# Patient Record
Sex: Male | Born: 1980 | Race: Black or African American | Hispanic: No | Marital: Single | Smoking: Current every day smoker
Health system: Southern US, Community
[De-identification: ages and names within clinical notes are randomized; demographics above are authoritative.]

## PROBLEM LIST (undated history)

## (undated) DIAGNOSIS — Z720 Tobacco use: Secondary | ICD-10-CM

## (undated) DIAGNOSIS — J42 Unspecified chronic bronchitis: Secondary | ICD-10-CM

## (undated) DIAGNOSIS — J9383 Other pneumothorax: Secondary | ICD-10-CM

## (undated) DIAGNOSIS — F1210 Cannabis abuse, uncomplicated: Secondary | ICD-10-CM

## (undated) HISTORY — PX: EYE SURGERY: SHX253

## (undated) HISTORY — DX: Tobacco use: Z72.0

## (undated) HISTORY — PX: DENTAL SURGERY: SHX609

## (undated) HISTORY — DX: Cannabis abuse, uncomplicated: F12.10

---
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
## 2002-02-03 ENCOUNTER — Emergency Department (HOSPITAL_COMMUNITY): Admission: EM | Admit: 2002-02-03 | Discharge: 2002-02-04 | Payer: Self-pay | Admitting: *Deleted

## 2002-03-24 ENCOUNTER — Emergency Department (HOSPITAL_COMMUNITY): Admission: EM | Admit: 2002-03-24 | Discharge: 2002-03-24 | Payer: Self-pay | Admitting: Emergency Medicine

## 2002-07-01 ENCOUNTER — Emergency Department (HOSPITAL_COMMUNITY): Admission: EM | Admit: 2002-07-01 | Discharge: 2002-07-01 | Payer: Self-pay | Admitting: Emergency Medicine

## 2002-09-20 ENCOUNTER — Emergency Department (HOSPITAL_COMMUNITY): Admission: EM | Admit: 2002-09-20 | Discharge: 2002-09-20 | Payer: Self-pay

## 2003-01-21 ENCOUNTER — Emergency Department (HOSPITAL_COMMUNITY): Admission: EM | Admit: 2003-01-21 | Discharge: 2003-01-21 | Payer: Self-pay | Admitting: Emergency Medicine

## 2003-06-30 ENCOUNTER — Emergency Department (HOSPITAL_COMMUNITY): Admission: EM | Admit: 2003-06-30 | Discharge: 2003-06-30 | Payer: Self-pay | Admitting: Emergency Medicine

## 2003-11-25 ENCOUNTER — Emergency Department (HOSPITAL_COMMUNITY): Admission: EM | Admit: 2003-11-25 | Discharge: 2003-11-25 | Payer: Self-pay | Admitting: Family Medicine

## 2003-12-01 ENCOUNTER — Emergency Department (HOSPITAL_COMMUNITY): Admission: EM | Admit: 2003-12-01 | Discharge: 2003-12-01 | Payer: Self-pay | Admitting: Family Medicine

## 2003-12-12 ENCOUNTER — Encounter: Admission: RE | Admit: 2003-12-12 | Discharge: 2003-12-12 | Payer: Self-pay | Admitting: Family Medicine

## 2003-12-30 ENCOUNTER — Encounter: Admission: RE | Admit: 2003-12-30 | Discharge: 2003-12-30 | Payer: Self-pay | Admitting: Sports Medicine

## 2004-02-25 ENCOUNTER — Emergency Department (HOSPITAL_COMMUNITY): Admission: EM | Admit: 2004-02-25 | Discharge: 2004-02-25 | Payer: Self-pay | Admitting: Emergency Medicine

## 2004-04-13 ENCOUNTER — Emergency Department (HOSPITAL_COMMUNITY): Admission: EM | Admit: 2004-04-13 | Discharge: 2004-04-13 | Payer: Self-pay | Admitting: Family Medicine

## 2004-04-19 ENCOUNTER — Encounter: Admission: RE | Admit: 2004-04-19 | Discharge: 2004-04-19 | Payer: Self-pay | Admitting: Sports Medicine

## 2004-04-23 ENCOUNTER — Encounter: Admission: RE | Admit: 2004-04-23 | Discharge: 2004-04-23 | Payer: Self-pay | Admitting: Family Medicine

## 2004-05-06 ENCOUNTER — Emergency Department (HOSPITAL_COMMUNITY): Admission: EM | Admit: 2004-05-06 | Discharge: 2004-05-06 | Payer: Self-pay | Admitting: Emergency Medicine

## 2005-04-09 ENCOUNTER — Emergency Department (HOSPITAL_COMMUNITY): Admission: EM | Admit: 2005-04-09 | Discharge: 2005-04-09 | Payer: Self-pay | Admitting: Emergency Medicine

## 2005-06-02 ENCOUNTER — Ambulatory Visit: Payer: Self-pay | Admitting: Family Medicine

## 2006-01-06 ENCOUNTER — Ambulatory Visit (HOSPITAL_COMMUNITY): Admission: RE | Admit: 2006-01-06 | Discharge: 2006-01-06 | Payer: Self-pay | Admitting: Chiropractic Medicine

## 2006-12-07 DIAGNOSIS — F17200 Nicotine dependence, unspecified, uncomplicated: Secondary | ICD-10-CM

## 2008-08-09 ENCOUNTER — Emergency Department (HOSPITAL_COMMUNITY): Admission: EM | Admit: 2008-08-09 | Discharge: 2008-08-09 | Payer: Self-pay | Admitting: Emergency Medicine

## 2011-03-02 ENCOUNTER — Encounter: Payer: Self-pay | Admitting: Family Medicine

## 2011-03-02 ENCOUNTER — Ambulatory Visit (INDEPENDENT_AMBULATORY_CARE_PROVIDER_SITE_OTHER): Payer: BC Managed Care – PPO | Admitting: Family Medicine

## 2011-03-02 VITALS — BP 153/92 | HR 102 | Temp 99.0°F | Ht 67.0 in | Wt 128.0 lb

## 2011-03-02 DIAGNOSIS — Z20828 Contact with and (suspected) exposure to other viral communicable diseases: Secondary | ICD-10-CM

## 2011-03-02 DIAGNOSIS — F17200 Nicotine dependence, unspecified, uncomplicated: Secondary | ICD-10-CM

## 2011-03-02 LAB — HIV ANTIBODY (ROUTINE TESTING W REFLEX): HIV: NONREACTIVE

## 2011-03-02 NOTE — Patient Instructions (Signed)
It was nice to see you today.  Please come back for labs.

## 2011-03-03 ENCOUNTER — Encounter: Payer: Self-pay | Admitting: Family Medicine

## 2011-03-03 NOTE — Assessment & Plan Note (Signed)
Explained to patient that BV is NOT and STD. Will go ahead and screen. Patient would like to get HIV, RPR, GC/CH - urine.

## 2011-03-03 NOTE — Progress Notes (Signed)
  Subjective:    Patient ID: Hunter Newman, male    DOB: [birth year], 30 y.o.   MRN: [id number]  HPI  1. New Patient: Reviewed Hx and updated chart.  2. STD Testing: Girlfriend recently Dx with BV. Patient wants to know more about this and be tested for STDs.  3. Tobacco Abuse: Daily.  Review of Systems No fever/chills, N/V/D/C, abdominal pain, penile DC, rash.    Objective:   Physical Exam  Vitals reviewed. Constitutional: He appears well-developed and well-nourished. No distress.  Cardiovascular: Normal rate, regular rhythm, normal heart sounds and intact distal pulses.   Pulmonary/Chest: Effort normal and breath sounds normal.  Abdominal: Soft. Bowel sounds are normal.      Assessment & Plan:

## 2011-03-03 NOTE — Assessment & Plan Note (Signed)
Advised to quit smoking °

## 2011-03-08 ENCOUNTER — Other Ambulatory Visit: Payer: BC Managed Care – PPO

## 2011-03-08 DIAGNOSIS — Z20828 Contact with and (suspected) exposure to other viral communicable diseases: Secondary | ICD-10-CM

## 2011-03-08 NOTE — Progress Notes (Signed)
gc urine done today Hunter Newman

## 2011-03-09 LAB — GC/CHLAMYDIA PROBE AMP, URINE
Chlamydia, Swab/Urine, PCR: POSITIVE — AB
GC Probe Amp, Urine: NEGATIVE

## 2011-03-10 ENCOUNTER — Telehealth: Payer: Self-pay | Admitting: *Deleted

## 2011-03-10 ENCOUNTER — Ambulatory Visit (INDEPENDENT_AMBULATORY_CARE_PROVIDER_SITE_OTHER): Payer: BC Managed Care – PPO | Admitting: *Deleted

## 2011-03-10 DIAGNOSIS — A749 Chlamydial infection, unspecified: Secondary | ICD-10-CM

## 2011-03-10 DIAGNOSIS — A7489 Other chlamydial diseases: Secondary | ICD-10-CM

## 2011-03-10 MED ORDER — AZITHROMYCIN 1 G PO PACK
1.0000 g | PACK | Freq: Once | ORAL | Status: AC
  Administered 2011-03-10: 1 g via ORAL

## 2011-03-10 NOTE — Telephone Encounter (Signed)
Message copied by Arlyss Repress on Thu Mar 10, 2011  2:13 PM ------      Message from: Helane Rima R      Created: Thu Mar 10, 2011  1:16 PM       Please call patient and inform him that he is positive for chlamydia. He needs to come in for Azithromycin 1 g slurry. Please inform him that his partner needs to be treated as well. I think that she is a patient here - if you give me her name, I will order treatment if she hasn't already gotten it.

## 2011-03-10 NOTE — Telephone Encounter (Signed)
Called pt and informed of results. He will schedule nurse visit for treatment. Lorenda Hatchet, Renato Battles

## 2011-03-10 NOTE — Progress Notes (Signed)
Patient requesting results of HIV . Advised him that test was negative.

## 2011-03-11 NOTE — Progress Notes (Signed)
Communicable Disease report faxed to [facility].

## 2011-06-13 ENCOUNTER — Emergency Department (HOSPITAL_COMMUNITY)
Admission: EM | Admit: 2011-06-13 | Discharge: 2011-06-13 | Disposition: A | Discharge status: Home / Self Care | Payer: BC Managed Care – PPO | Attending: Emergency Medicine | Admitting: Emergency Medicine

## 2011-06-13 DIAGNOSIS — S058X9A Other injuries of unspecified eye and orbit, initial encounter: Secondary | ICD-10-CM | POA: Insufficient documentation

## 2011-06-13 DIAGNOSIS — IMO0002 Reserved for concepts with insufficient information to code with codable children: Secondary | ICD-10-CM | POA: Insufficient documentation

## 2012-05-14 ENCOUNTER — Emergency Department (HOSPITAL_COMMUNITY): Payer: BC Managed Care – PPO

## 2012-05-14 ENCOUNTER — Encounter (HOSPITAL_COMMUNITY): Payer: Self-pay | Admitting: *Deleted

## 2012-05-14 ENCOUNTER — Emergency Department (HOSPITAL_COMMUNITY)
Admission: EM | Admit: 2012-05-14 | Discharge: 2012-05-14 | Disposition: A | Discharge status: Home / Self Care | Payer: BC Managed Care – PPO | Attending: Emergency Medicine | Admitting: Emergency Medicine

## 2012-05-14 DIAGNOSIS — R05 Cough: Secondary | ICD-10-CM

## 2012-05-14 DIAGNOSIS — J069 Acute upper respiratory infection, unspecified: Secondary | ICD-10-CM

## 2012-05-14 DIAGNOSIS — F17200 Nicotine dependence, unspecified, uncomplicated: Secondary | ICD-10-CM | POA: Insufficient documentation

## 2012-05-14 NOTE — ED Notes (Signed)
Pt c/o HA, cough, sneezing over weekend.

## 2012-05-14 NOTE — ED Notes (Signed)
Pt reports having productive cough blood tinged sputum since Friday,along nasal congestion. Right sided headache, and pain/ numbness on right side of mouth. Pain 6/10.

## 2012-05-14 NOTE — ED Provider Notes (Signed)
History     CSN: [id number]  Arrival date & time 05/14/12  1443   First MD Initiated Contact with Patient 05/14/12 1656      Chief Complaint  Patient presents with  . Headache  . URI    (Consider location/radiation/quality/duration/timing/severity/associated sxs/prior treatment) HPI Comments: Patient presents with complaint of upper respiratory tract infection symptoms as well as cough for the past 3 days. Patient states he has had nasal congestion and a runny nose. He has had a sore throat. He denies nausea or vomiting. He states that he has been coughing up blood. He states that he has noted approximately a teaspoon of blood when he coughed however this is improved today. Patient does not have a history of bleeding problems. Patient has been using over-the-counter cold medicine with mild relief. Otherwise nothing makes symptoms better or worse. Onset was gradual. Course is constant.  Patient is a 31 y.o. male presenting with URI. The history is provided by the patient.  URI The primary symptoms include fatigue, headaches, sore throat and cough. Primary symptoms do not include fever, ear pain, wheezing, abdominal pain, nausea, vomiting, myalgias or rash. The current episode started 3 to 5 days ago. This is a new problem. The problem has not changed since onset. Symptoms associated with the illness include sinus pressure, congestion and rhinorrhea.    Past Medical History  Diagnosis Date  . Tobacco abuse   . Marijuana abuse     History reviewed. No pertinent past surgical history.  History reviewed. No pertinent family history.  History  Substance Use Topics  . Smoking status: Current Everyday Smoker  . Smokeless tobacco: Not on file  . Alcohol Use: Yes     occasionally      Review of Systems  Constitutional: Positive for fatigue. Negative for fever.  HENT: Positive for congestion, sore throat, rhinorrhea and sinus pressure. Negative for ear pain.   Eyes: Negative for  redness.  Respiratory: Positive for cough. Negative for wheezing.   Cardiovascular: Negative for chest pain.  Gastrointestinal: Negative for nausea, vomiting, abdominal pain and diarrhea.  Genitourinary: Negative for dysuria.  Musculoskeletal: Negative for myalgias.  Skin: Negative for rash.  Neurological: Positive for headaches.    Allergies  Review of patient's allergies indicates no known allergies.  Home Medications   Current Outpatient Rx  Name Route Sig Dispense Refill  . CHILDRENS NYQUIL COLD/COUGH PO Oral Take 2 tablets by mouth 2 (two) times daily as needed. Cold symptoms      BP 125/76  Pulse 81  Temp 98.7 F (37.1 C) (Oral)  Resp 16  SpO2 99%  Physical Exam  Nursing note and vitals reviewed. Constitutional: He appears well-developed and well-nourished.  HENT:  Head: Normocephalic and atraumatic.  Right Ear: Hearing, tympanic membrane and ear canal normal.  Left Ear: Hearing, tympanic membrane and ear canal normal.  Nose: Mucosal edema and rhinorrhea present. Right sinus exhibits no maxillary sinus tenderness and no frontal sinus tenderness. Left sinus exhibits no maxillary sinus tenderness and no frontal sinus tenderness.  Mouth/Throat: Uvula is midline, oropharynx is clear and moist and mucous membranes are normal. No posterior oropharyngeal erythema.  Eyes: Conjunctivae are normal. Right eye exhibits no discharge. Left eye exhibits no discharge.  Neck: Normal range of motion. Neck supple.  Cardiovascular: Normal rate, regular rhythm and normal heart sounds.   Pulmonary/Chest: Effort normal and breath sounds normal. No respiratory distress. He has no wheezes. He has no rales.  Abdominal: Soft. There is no tenderness.  Neurological: He is alert.  Skin: Skin is warm and dry.  Psychiatric: He has a normal mood and affect.    ED Course  Procedures (including critical care time)  Labs Reviewed - No data to display Dg Chest 2 View  05/14/2012  *RADIOLOGY  REPORT*  Clinical Data: Cough, chills, chest pain, smoking history  CHEST - 2 VIEW  Comparison: The chest x-ray of 05/06/2004  Findings: The lungs are clear but hyperaerated.  Mediastinal contours appear stable.  The heart is within normal limits in size. No bony abnormality is seen.  IMPRESSION: Hyperaeration.  No active lung disease.  Original Report Authenticated By: Juline Patch, M.D.     1. URI (upper respiratory infection)   2. Cough     5:04 PM Patient seen and examined. Counseled on URI treatment. Will check CXR given patient reports of coughing up blood.   Vital signs reviewed and are as follows: Filed Vitals:   05/14/12 1556  BP: 125/76  Pulse: 81  Temp: 98.7 F (37.1 C)  Resp: 16   5:58 PM x-ray findings reviewed. Patient informed of results. I encouraged patient to use supportive treatment such as over-the-counter cold and cough medications, ibuprofen, and Afrin nasal spray. Instructed the patient not to use Afrin nasal spray for more than 3 days to prevent rebound congestion. Patient counseled to return with worsening persistent fever, shortness of breath, or if he has any other concerns. Patient verbalized understanding recent.   MDM  Patient presents with URI symptoms as well as cough. Chest x-ray done because patient reported hemoptysis. Chest x-ray is concerning for infection or other acute process. Patient counseled on supportive treatment in signs and symptoms that should cause them to return. Patient appears well and is in no respiratory distress at time of discharge.       Renne Crigler, [place] 05/14/12 1800

## 2012-05-15 NOTE — ED Provider Notes (Signed)
Medical screening examination/treatment/procedure(s) were performed by non-physician practitioner and as supervising physician I was immediately available for consultation/collaboration.  Cyndra Numbers, MD 05/15/12 1249

## 2013-02-21 ENCOUNTER — Ambulatory Visit (INDEPENDENT_AMBULATORY_CARE_PROVIDER_SITE_OTHER): Payer: BC Managed Care – PPO | Admitting: Family Medicine

## 2013-02-21 ENCOUNTER — Encounter: Payer: Self-pay | Admitting: Family Medicine

## 2013-02-21 ENCOUNTER — Other Ambulatory Visit (HOSPITAL_COMMUNITY)
Admission: RE | Admit: 2013-02-21 | Discharge: 2013-02-21 | Disposition: A | Discharge status: Home / Self Care | Payer: BC Managed Care – PPO | Source: Ambulatory Visit | Attending: Family Medicine | Admitting: Family Medicine

## 2013-02-21 VITALS — BP 137/81 | HR 107 | Temp 98.1°F | Ht 67.0 in | Wt 123.2 lb

## 2013-02-21 DIAGNOSIS — Z2089 Contact with and (suspected) exposure to other communicable diseases: Secondary | ICD-10-CM

## 2013-02-21 DIAGNOSIS — Z8619 Personal history of other infectious and parasitic diseases: Secondary | ICD-10-CM | POA: Insufficient documentation

## 2013-02-21 DIAGNOSIS — Z202 Contact with and (suspected) exposure to infections with a predominantly sexual mode of transmission: Secondary | ICD-10-CM

## 2013-02-21 DIAGNOSIS — Z113 Encounter for screening for infections with a predominantly sexual mode of transmission: Secondary | ICD-10-CM | POA: Insufficient documentation

## 2013-02-21 DIAGNOSIS — J069 Acute upper respiratory infection, unspecified: Secondary | ICD-10-CM | POA: Insufficient documentation

## 2013-02-21 MED ORDER — BENZONATATE 100 MG PO CAPS: 100.0000 mg | ORAL_CAPSULE | Freq: Three times a day (TID) | ORAL | Status: DC | PRN

## 2013-02-21 NOTE — Progress Notes (Signed)
Subjective:     Patient ID: Hunter Newman, male   DOB: October 15, 1980, 32 y.o.   MRN: [id number]  CC - chest congestion and desire for STD screen  HPI - Hunter Newman is a 32 y.o. male with h/o tobacco dependence and "chronic bronchitis" here with chest congestion and desiring STD screening.   1. Chest congestion - Per patient, he has had bronchitis for 13 years and has been smoking for 15 years. For 6 days, patient has had coughing and phlegm that he has been unable to bring up, with pain in his chest when he coughs. Laying down makes coughing worse though he is not needing more pillows than normal. Has tried theraflu and cough drops which did not help. Has not had these symptoms before. Has rhinorrhea, sneezing. Denies fever, chills, h/o allergies, eye/ear drainage, sore throat though it was sore Friday, swollen lymph nodes, rash, nausea, vomiting, diarrhea, or abdominal pain.  2. STD screening - Pt would like screening but denies new sexual partner, pain, discharge, rash, or swollen lymph nodes or weight loss. Had HIV test 1 year ago which was negative. H/o chlamydia with treatment and partner treatment.   Review of Systems - Per HPI. Other systems reviewed and negative.  Past Medical History  Diagnosis Date  . Tobacco abuse   . Marijuana abuse   Bronchitis chronic  No new medicines Hospitalized once years ago for dehydration No surgeries  SH:  Tobacco - 1/2 PPD x 15 years - Wants to quit but does not feel ready; has tried and failed once before.     Objective:   Physical Exam BP 137/81  Pulse 107  Temp(Src) 98.1 F (36.7 C) (Oral)  Ht 5\' 7"  (1.702 m)  Wt 123 lb 3.2 oz (55.883 kg)  BMI 19.29 kg/m2 GEN: NAD CV: RRR, no m/r/g PULM: Coarse at bases, no wheezes or crackles, normal effort, dry cough with deep inspiration HEENT: Sclera mildly erythematous, EOMI, PERRL, nasal turbinates erythmatous with no exudate, o/p erythematous with no exudate. TMs clear bilaterally, no sinus  tenderness  ABD: s/nt/nd/ no organomegaly EXTR: no LE edema GU: No discharge, no mass, no testicular tenderness, no inguinal LAD LYMPH: shotty submandibular LAD     Assessment:     Hunter Newman is a 32 y.o. male with h/o tobacco dependence and "chronic bronchitis" here with chest congestion and desiring STD screening.     Plan:     # See problem list for problem-specific plans.

## 2013-02-21 NOTE — Patient Instructions (Addendum)
It was good to meet you today Mr Hunter Newman.  For your cough, I think you have a viral upper respiratory infection. You do not have signs of a bacterial infection, and this should get better on its own. In the mean time, I will given you a prescription for a medicine that may help with your cough. For the pain from coughing, you can try ibuprofen or tylenol as needed. If your cough gets worse, you develop fever, shortness of breath, or any other concerning symptoms, please come back to be re-evaluated.  We are getting some other labs today and I will call you with the result. Call if you do not hear anything within 1-2 weeks.  Dr. Benjamin Stain 02/21/2013 4:47 PM

## 2013-02-21 NOTE — Assessment & Plan Note (Signed)
Patient requesting screen today but with no symptoms or exam findings consistent with infection. - HIV/RPR - urine GC/Chlamydia - Pt wishes to be called with results.

## 2013-02-21 NOTE — Assessment & Plan Note (Addendum)
With no fever, crackles, wheezes, and stable other than postnasal drip symptoms, URI is most likely. - Reassure - Treat symptomatically with tessalon perles and ibuprofen PRN - Return precautions reviewed. - If no improvement, consider allergic rhinitis postnasal drip - Discussed Tobacco cessation at length. Pt interested but not ready. Will call and set up an appointment when ready to set quit date and discuss medications, which he was interested in.

## 2013-02-22 ENCOUNTER — Telehealth: Payer: Self-pay | Admitting: Family Medicine

## 2013-02-22 NOTE — Telephone Encounter (Signed)
Called and left message for patient to call office. When he calls, please let him know his HIV and syphilis testing (RPR) were negative. Other tests still pending and I will either call him when these come in, or if he does not hear from [place] in 2 weeks he can call in.  Simone Curia 02/22/2013 1:38 PM

## 2013-02-22 NOTE — Telephone Encounter (Signed)
Called but did not get patient again. When he calls, please let him know gonorrhea and chlamydia tests also negative.  Simone Curia 02/22/2013 7:08 PM

## 2013-03-29 ENCOUNTER — Emergency Department (HOSPITAL_COMMUNITY): Payer: BC Managed Care – PPO

## 2013-03-29 ENCOUNTER — Emergency Department (HOSPITAL_COMMUNITY)
Admission: EM | Admit: 2013-03-29 | Discharge: 2013-03-29 | Disposition: A | Discharge status: Home / Self Care | Payer: BC Managed Care – PPO | Attending: Emergency Medicine | Admitting: Emergency Medicine

## 2013-03-29 ENCOUNTER — Encounter (HOSPITAL_COMMUNITY): Payer: Self-pay | Admitting: Emergency Medicine

## 2013-03-29 DIAGNOSIS — Z8709 Personal history of other diseases of the respiratory system: Secondary | ICD-10-CM | POA: Insufficient documentation

## 2013-03-29 DIAGNOSIS — J069 Acute upper respiratory infection, unspecified: Secondary | ICD-10-CM | POA: Insufficient documentation

## 2013-03-29 DIAGNOSIS — R05 Cough: Secondary | ICD-10-CM | POA: Insufficient documentation

## 2013-03-29 DIAGNOSIS — E860 Dehydration: Secondary | ICD-10-CM | POA: Insufficient documentation

## 2013-03-29 DIAGNOSIS — R062 Wheezing: Secondary | ICD-10-CM | POA: Insufficient documentation

## 2013-03-29 DIAGNOSIS — R059 Cough, unspecified: Secondary | ICD-10-CM | POA: Insufficient documentation

## 2013-03-29 DIAGNOSIS — F17200 Nicotine dependence, unspecified, uncomplicated: Secondary | ICD-10-CM | POA: Insufficient documentation

## 2013-03-29 DIAGNOSIS — R509 Fever, unspecified: Secondary | ICD-10-CM | POA: Insufficient documentation

## 2013-03-29 DIAGNOSIS — R0789 Other chest pain: Secondary | ICD-10-CM | POA: Insufficient documentation

## 2013-03-29 HISTORY — DX: Unspecified chronic bronchitis: J42

## 2013-03-29 LAB — CBC WITH DIFFERENTIAL/PLATELET
Basophils Absolute: 0 10*3/uL (ref 0.0–0.1)
Eosinophils Absolute: 0.1 10*3/uL (ref 0.0–0.7)
Eosinophils Relative: 1 % (ref 0–5)
HCT: 39 % (ref 39.0–52.0)
Lymphocytes Relative: 7 % — ABNORMAL LOW (ref 12–46)
Lymphs Abs: 1.4 10*3/uL (ref 0.7–4.0)
MCH: 34.6 pg — ABNORMAL HIGH (ref 26.0–34.0)
MCHC: 36.2 g/dL — ABNORMAL HIGH (ref 30.0–36.0)
Monocytes Absolute: 1.2 10*3/uL — ABNORMAL HIGH (ref 0.1–1.0)
Monocytes Relative: 6 % (ref 3–12)
Neutro Abs: 16.7 10*3/uL — ABNORMAL HIGH (ref 1.7–7.7)
RDW: 12.7 % (ref 11.5–15.5)
WBC: 19.4 10*3/uL — ABNORMAL HIGH (ref 4.0–10.5)

## 2013-03-29 LAB — BASIC METABOLIC PANEL
Creatinine, Ser: 0.88 mg/dL (ref 0.50–1.35)
GFR calc Af Amer: >90 mL/min (ref >90)
GFR calc non Af Amer: >90 mL/min (ref >90)
Glucose, Bld: 88 mg/dL (ref 70–99)
Potassium: 3.1 mEq/L — ABNORMAL LOW (ref 3.5–5.1)

## 2013-03-29 MED ORDER — SODIUM CHLORIDE 0.9 % IV BOLUS (SEPSIS)
1000.0000 mL | Freq: Once | INTRAVENOUS | Status: AC
  Administered 2013-03-29: 1000 mL via INTRAVENOUS

## 2013-03-29 MED ORDER — IBUPROFEN 800 MG PO TABS
800.0000 mg | ORAL_TABLET | Freq: Once | ORAL | Status: AC
  Administered 2013-03-29: 800 mg via ORAL
  Filled 2013-03-29: qty 1

## 2013-03-29 MED ORDER — AZITHROMYCIN 250 MG PO TABS: 250.0000 mg | ORAL_TABLET | Freq: Every day | ORAL | Status: DC

## 2013-03-29 MED ORDER — ACETAMINOPHEN 325 MG PO TABS
650.0000 mg | ORAL_TABLET | Freq: Once | ORAL | Status: AC
  Administered 2013-03-29: 650 mg via ORAL
  Filled 2013-03-29: qty 1

## 2013-03-29 MED ORDER — ALBUTEROL SULFATE (5 MG/ML) 0.5% IN NEBU
5.0000 mg | INHALATION_SOLUTION | Freq: Once | RESPIRATORY_TRACT | Status: AC
  Administered 2013-03-29: 5 mg via RESPIRATORY_TRACT
  Filled 2013-03-29: qty 1

## 2013-03-29 MED ORDER — ALBUTEROL SULFATE HFA 108 (90 BASE) MCG/ACT IN AERS
2.0000 | INHALATION_SPRAY | RESPIRATORY_TRACT | Status: DC | PRN
  Administered 2013-03-29: 2 via RESPIRATORY_TRACT
  Filled 2013-03-29: qty 6.7

## 2013-03-29 MED ORDER — GUAIFENESIN-CODEINE 100-10 MG/5ML PO SYRP: 5.0000 mL | ORAL_SOLUTION | Freq: Three times a day (TID) | ORAL | Status: DC | PRN

## 2013-03-29 MED ORDER — POTASSIUM CHLORIDE CRYS ER 20 MEQ PO TBCR
40.0000 meq | EXTENDED_RELEASE_TABLET | Freq: Once | ORAL | Status: AC
  Administered 2013-03-29: 40 meq via ORAL
  Filled 2013-03-29: qty 1

## 2013-03-29 MED ORDER — AZITHROMYCIN 250 MG PO TABS
500.0000 mg | ORAL_TABLET | Freq: Once | ORAL | Status: AC
  Administered 2013-03-29: 500 mg via ORAL
  Filled 2013-03-29: qty 1

## 2013-03-29 MED ORDER — IPRATROPIUM BROMIDE 0.02 % IN SOLN
0.5000 mg | Freq: Once | RESPIRATORY_TRACT | Status: AC
  Administered 2013-03-29: 0.5 mg via RESPIRATORY_TRACT
  Filled 2013-03-29: qty 5

## 2013-03-29 NOTE — ED Notes (Signed)
Pt from home, reports that he has had dry, non-productive cough x1 month. Pt states when something comes up, it is yellow.  Pt adds that he feels SOB and weak. Pt has taken rx meds for cough, but has not helped. Pt denies N/V, but  a little diarrhea yesterday. Pt in NAD and A&O.

## 2013-03-29 NOTE — ED Provider Notes (Signed)
Medical screening examination/treatment/procedure(s) were performed by non-physician practitioner and as supervising physician I was immediately available for consultation/collaboration.  Bolden Hagerman, MD 03/29/13 1601 

## 2013-03-29 NOTE — ED Provider Notes (Signed)
History     CSN: [id number]  Arrival date & time 03/29/13  1146   First MD Initiated Contact with Patient 03/29/13 1215      Chief Complaint  Patient presents with  . Cough  . Shortness of Breath    (Consider location/radiation/quality/duration/timing/severity/associated sxs/prior treatment) HPI  Hunter Newman is a 32 y.o.male presenting to the ER with complaints of cough, fevers, SOB and chest pain all over when he coughs. He was seen and treated 1 month ago and given Tessalon pearls but he feels it made his symptoms worse. He is coughing up phlegm without hemoptysis. He is starting to feel weak. In triage it is noted that he is wheezing and is pulse is between 117-130. He has not had any breathing treatment or taken any medication today. Decreased oral intake.   Past Medical History  Diagnosis Date  . Tobacco abuse   . Marijuana abuse   . Chronic bronchitis     Past Surgical History  Procedure Laterality Date  . Eye surgery Left     lac repaired    No family history on file.  History  Substance Use Topics  . Smoking status: Current Every Day Smoker  . Smokeless tobacco: Not on file  . Alcohol Use: Yes     Comment: occasionally      Review of Systems  Respiratory: Positive for cough, chest tightness, shortness of breath and wheezing.   All other systems reviewed and are negative.    Allergies  Review of patient's allergies indicates no known allergies.  Home Medications   Current Outpatient Rx  Name  Route  Sig  Dispense  Refill  . benzonatate (TESSALON) 100 MG capsule   Oral   Take 1 capsule (100 mg total) by mouth 3 (three) times daily as needed for cough.   20 capsule   0   . Pseudoephedrine-APAP-DM (DAYQUIL MULTI-SYMPTOM PO)   Oral   Take 2 capsules by mouth every 4 (four) hours.           BP 144/89  Pulse 91  Temp(Src) 100.3 F (37.9 C) (Oral)  Resp 18  SpO2 100%  Physical Exam  Nursing note and vitals reviewed. Constitutional:  He appears well-developed and well-nourished. No distress.  HENT:  Head: Normocephalic and atraumatic.  Eyes: Pupils are equal, round, and reactive to light.  Neck: Normal range of motion. Neck supple.  Cardiovascular: Normal rate and regular rhythm.   Pulmonary/Chest: Effort normal. He has wheezes (diffuse). He has rhonchi. He exhibits no tenderness, no bony tenderness, no laceration and no crepitus.  Abdominal: Soft.  Neurological: He is alert.  Skin: Skin is warm and dry.    ED Course  Procedures (including critical care time)  Labs Reviewed  CBC WITH DIFFERENTIAL - Abnormal; Notable for the following:    WBC 19.4 (*)    RBC 4.08 (*)    MCH 34.6 (*)    MCHC 36.2 (*)    Neutrophils Relative % 86 (*)    Neutro Abs 16.7 (*)    Lymphocytes Relative 7 (*)    Monocytes Absolute 1.2 (*)    All other components within normal limits  BASIC METABOLIC PANEL - Abnormal; Notable for the following:    Potassium 3.1 (*)    All other components within normal limits   Dg Chest 2 View  03/29/2013   *RADIOLOGY REPORT*  Clinical Data: Shortness of breath, chest pain and cough.  CHEST - 2 VIEW  Comparison: PA and  lateral chest 05/14/2012.  Findings: Lungs are clear.  Heart size is normal.  No pneumothorax or pleural fluid.  IMPRESSION: Negative chest.   Original Report Authenticated By: Holley Dexter, M.D.     1. Dehydration   2. URI (upper respiratory infection)       MDM  Patient is tachycardic, wheezing and coughing, most likely dehydrated.  2L NS bolus given, labs checked, neb albuterol/atrovent given.  Elevated white count and normal chest xray, suspect atypical lung infection. He is otherwise young and healthy I do not think he needs admission.   Will give Azithromycin and Albuterol HFA. Patients pulse Significantly improved as well as chest tightness and cough after treatment. With no longer having tachycardia, he is PERC negative for PE. He has been made aware of symptoms  that warrant return to the ED.  32 y.o.Hunter Newman's evaluation in the Emergency Department is complete. It has been determined that no acute conditions requiring further emergency intervention are present at this time. The patient/guardian have been advised of the diagnosis and plan. We have discussed signs and symptoms that warrant return to the ED, such as changes or worsening in symptoms.  Vital signs are stable at discharge. Filed Vitals:   03/29/13 1406  BP:   Pulse: 91  Temp:   Resp: 18    Patient/guardian has voiced understanding and agreed to follow-up with the PCP or specialist.         Dorthula Matas, PA-C 03/29/13 1434  Dorthula Matas, PA-C 03/29/13 1443

## 2013-04-04 ENCOUNTER — Ambulatory Visit (INDEPENDENT_AMBULATORY_CARE_PROVIDER_SITE_OTHER): Payer: BC Managed Care – PPO | Admitting: Family Medicine

## 2013-04-04 ENCOUNTER — Emergency Department (HOSPITAL_COMMUNITY)
Admission: EM | Admit: 2013-04-04 | Discharge: 2013-04-04 | Discharge status: Against Medical Advice | Payer: BC Managed Care – PPO | Attending: Emergency Medicine | Admitting: Emergency Medicine

## 2013-04-04 ENCOUNTER — Encounter (HOSPITAL_COMMUNITY): Payer: Self-pay | Admitting: *Deleted

## 2013-04-04 ENCOUNTER — Ambulatory Visit: Payer: BC Managed Care – PPO

## 2013-04-04 DIAGNOSIS — S139XXA Sprain of joints and ligaments of unspecified parts of neck, initial encounter: Secondary | ICD-10-CM

## 2013-04-04 DIAGNOSIS — S199XXA Unspecified injury of neck, initial encounter: Secondary | ICD-10-CM | POA: Insufficient documentation

## 2013-04-04 DIAGNOSIS — F17200 Nicotine dependence, unspecified, uncomplicated: Secondary | ICD-10-CM | POA: Insufficient documentation

## 2013-04-04 DIAGNOSIS — S134XXA Sprain of ligaments of cervical spine, initial encounter: Secondary | ICD-10-CM

## 2013-04-04 DIAGNOSIS — Y92410 Unspecified street and highway as the place of occurrence of the external cause: Secondary | ICD-10-CM | POA: Insufficient documentation

## 2013-04-04 DIAGNOSIS — S0993XA Unspecified injury of face, initial encounter: Secondary | ICD-10-CM | POA: Insufficient documentation

## 2013-04-04 DIAGNOSIS — Y9389 Activity, other specified: Secondary | ICD-10-CM | POA: Insufficient documentation

## 2013-04-04 MED ORDER — OXAPROZIN 600 MG PO TABS: ORAL_TABLET | ORAL | Status: DC

## 2013-04-04 MED ORDER — METHOCARBAMOL 750 MG PO TABS: 750.0000 mg | ORAL_TABLET | Freq: Four times a day (QID) | ORAL | Status: DC
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
## 2013-04-04 NOTE — ED Notes (Signed)
Pt not in room.

## 2013-04-04 NOTE — ED Notes (Signed)
Pt not in room. Apparently left before being seen.
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
## 2013-04-04 NOTE — Patient Instructions (Signed)
Take the methocarbamol muscle relaxants one in the morning, one in the afternoon, and 2 at bedtime  Take the oxaprozin (Daypro) 1 at breakfast and one at supper for pain and inflammation  You can take Tylenol 2 pills 3 -4 times daily as needed for pain in addition to this  If worse at anytime return. If not much better over the next week come back because you might need physical therapy.

## 2013-04-26 ENCOUNTER — Encounter (HOSPITAL_COMMUNITY): Payer: Self-pay | Admitting: *Deleted

## 2013-04-26 ENCOUNTER — Emergency Department (HOSPITAL_COMMUNITY)
Admission: EM | Admit: 2013-04-26 | Discharge: 2013-04-26 | Disposition: A | Discharge status: Home / Self Care | Payer: BC Managed Care – PPO | Attending: Emergency Medicine | Admitting: Emergency Medicine

## 2013-04-26 DIAGNOSIS — S199XXA Unspecified injury of neck, initial encounter: Secondary | ICD-10-CM | POA: Insufficient documentation

## 2013-04-26 DIAGNOSIS — Z8709 Personal history of other diseases of the respiratory system: Secondary | ICD-10-CM | POA: Insufficient documentation

## 2013-04-26 DIAGNOSIS — Y9389 Activity, other specified: Secondary | ICD-10-CM | POA: Insufficient documentation

## 2013-04-26 DIAGNOSIS — S0993XA Unspecified injury of face, initial encounter: Secondary | ICD-10-CM | POA: Insufficient documentation

## 2013-04-26 DIAGNOSIS — F17200 Nicotine dependence, unspecified, uncomplicated: Secondary | ICD-10-CM | POA: Insufficient documentation

## 2013-04-26 DIAGNOSIS — M542 Cervicalgia: Secondary | ICD-10-CM

## 2013-04-26 DIAGNOSIS — Y92410 Unspecified street and highway as the place of occurrence of the external cause: Secondary | ICD-10-CM | POA: Insufficient documentation

## 2013-04-26 MED ORDER — TRAMADOL HCL 50 MG PO TABS: 50.0000 mg | ORAL_TABLET | Freq: Four times a day (QID) | ORAL | Status: DC | PRN
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
## 2013-04-30 NOTE — ED Provider Notes (Signed)
Medical screening examination/treatment/procedure(s) were performed by non-physician practitioner and as supervising physician I was immediately available for consultation/collaboration.  Candyce Churn, MD 04/30/13 [phone]

## 2014-05-22 ENCOUNTER — Encounter: Payer: Self-pay | Admitting: Family Medicine

## 2014-05-22 ENCOUNTER — Ambulatory Visit (INDEPENDENT_AMBULATORY_CARE_PROVIDER_SITE_OTHER): Payer: BC Managed Care – PPO | Admitting: Family Medicine

## 2014-05-22 VITALS — BP 155/92 | HR 87 | Ht 66.0 in | Wt 127.0 lb

## 2014-05-22 DIAGNOSIS — R21 Rash and other nonspecific skin eruption: Secondary | ICD-10-CM | POA: Insufficient documentation

## 2014-05-22 LAB — CBC
HEMATOCRIT: 40.2 % (ref 39.0–52.0)
HEMOGLOBIN: 14 g/dL (ref 13.0–17.0)
MCH: 34.3 pg — ABNORMAL HIGH (ref 26.0–34.0)
MCHC: 34.8 g/dL (ref 30.0–36.0)
MCV: 98.5 fL (ref 78.0–100.0)
Platelets: 266 10*3/uL (ref 150–400)
RBC: 4.08 MIL/uL — AB (ref 4.22–5.81)
RDW: 13.8 % (ref 11.5–15.5)
WBC: 9.8 10*3/uL (ref 4.0–10.5)

## 2014-05-22 NOTE — Progress Notes (Signed)
   Subjective:    Patient ID: Hunter Newman, male    DOB: 1981-03-14, 33 y.o.   MRN: [id number]  HPI Hunter Newman is here in same day clinic for rash.   He noticed the rash yesterday. There is no pain or itching associated with it. He has been afebrile. He denies any nausea, vomiting, diarrhea, or constipation. Rash started on his trunk and spread to his back. Healing spots on her upper arms but none on his hands, legs, feet or mouth. He went to [place] this past weekend to take his kids to the state fair. He was outside but denies being bitten by any bugs. He is sexually active with one partner and doesn't use protection. His partner has a history of STI.  He denies any penile discharge or burning with urination. He denies any joint pain or myalgia. His kids were sick and they go to daycare. He hasn't changed soap, shampoo, or deodorants. He hasn't started any new medications. He has never had prior poison ivy infection.   Current Outpatient Prescriptions on File Prior to Visit  Medication Sig Dispense Refill  . albuterol (PROVENTIL HFA;VENTOLIN HFA) 108 (90 BASE) MCG/ACT inhaler Inhale 2 puffs into the lungs every 6 (six) hours as needed for wheezing or shortness of breath.      . traMADol (ULTRAM) 50 MG tablet Take 1 tablet (50 mg total) by mouth every 6 (six) hours as needed for pain.  15 tablet  0   No current facility-administered medications on file prior to visit.    Review of Systems See HPI     Objective:   Physical Exam BP 155/92  Pulse 87  Ht 5\' 6"  (1.676 m)  Wt 127 lb (57.607 kg)  BMI 20.51 kg/m2 Gen: NAD, alert, cooperative with exam, well-appearing Skin: maculopapular rash on his trunk and back. None on his hands, feet or mouth. None on his arms or his legs.         Assessment & Plan:

## 2014-05-22 NOTE — Patient Instructions (Signed)
Thank you for coming in,   Please call back in two weeks if the rash is still there and I can call in a prescription steroid cream.   Let me know if it is causing any pain or itching.   We will call you with the results of today's labs.    Please feel free to call with any questions or concerns at any time, at [phone]. --Dr. Jordan LikesSchmitz

## 2014-05-22 NOTE — Assessment & Plan Note (Signed)
Broad range of possibilities causing rash. Looks similarly to pityriasis rosea but no herald patch.  Doubt any tick borne illness with any myalgia or joint pain. Possible for STI with partner having previous STI as well as patient having history of STI. Afebrile.  - will watch for now as not causing pain or itch  - HIV, RPR, urine cytology: pending  - f/u in two weeks; at that time consider adding a topical steroid

## 2014-05-23 ENCOUNTER — Other Ambulatory Visit (HOSPITAL_COMMUNITY)
Admission: RE | Admit: 2014-05-23 | Discharge: 2014-05-23 | Disposition: A | Discharge status: Home / Self Care | Payer: BC Managed Care – PPO | Source: Ambulatory Visit | Attending: Family Medicine | Admitting: Family Medicine

## 2014-05-23 DIAGNOSIS — Z113 Encounter for screening for infections with a predominantly sexual mode of transmission: Secondary | ICD-10-CM | POA: Insufficient documentation

## 2014-05-23 LAB — HIV ANTIBODY (ROUTINE TESTING W REFLEX): HIV 1&2 Ab, 4th Generation: NONREACTIVE

## 2014-05-23 LAB — RPR

## 2014-05-23 NOTE — Addendum Note (Signed)
Addended by: Herminio HeadsHOLDER, Paytyn Mesta on: 05/23/2014 03:34 PM   Modules accepted: Orders

## 2014-05-26 ENCOUNTER — Telehealth: Payer: Self-pay | Admitting: *Deleted

## 2014-05-26 NOTE — Telephone Encounter (Signed)
LVM for patient to call back. ?

## 2014-05-26 NOTE — Telephone Encounter (Signed)
Message copied by Farrell OursEVANS, Ventura Hollenbeck K on Mon May 26, 2014  1:44 PM ------      Message from: Clare GandySCHMITZ, JEREMY E      Created: Mon May 26, 2014 12:34 PM       Please call patient and inform that all his labs are normal. Still waiting on urine GC/Chlamydia.  Thank you. ------

## 2014-05-27 ENCOUNTER — Other Ambulatory Visit: Payer: Self-pay | Admitting: Family Medicine

## 2014-05-27 DIAGNOSIS — R21 Rash and other nonspecific skin eruption: Secondary | ICD-10-CM

## 2014-05-27 MED ORDER — CETIRIZINE HCL 10 MG PO TABS: 10.0000 mg | ORAL_TABLET | Freq: Every day | ORAL | Status: DC

## 2014-05-27 NOTE — Telephone Encounter (Signed)
Message copied by Farrell OursEVANS, Karlee Staff K on Tue May 27, 2014  9:42 AM ------      Message from: Myra RudeSCHMITZ, JEREMY E      Created: Tue May 27, 2014  9:02 AM       Please call patient and inform that all his labs are normal. Thank you. ------

## 2014-05-27 NOTE — Telephone Encounter (Signed)
Spoke with patient and informed him of below 

## 2014-07-07 ENCOUNTER — Ambulatory Visit (INDEPENDENT_AMBULATORY_CARE_PROVIDER_SITE_OTHER): Payer: BC Managed Care – PPO | Admitting: Family Medicine

## 2014-07-07 ENCOUNTER — Encounter: Payer: Self-pay | Admitting: Family Medicine

## 2014-07-07 VITALS — BP 153/89 | HR 80 | Temp 99.1°F | Wt 125.0 lb

## 2014-07-07 DIAGNOSIS — Z202 Contact with and (suspected) exposure to infections with a predominantly sexual mode of transmission: Secondary | ICD-10-CM | POA: Insufficient documentation

## 2014-07-07 MED ORDER — METRONIDAZOLE 500 MG PO TABS: 500.0000 mg | ORAL_TABLET | Freq: Two times a day (BID) | ORAL | Status: DC

## 2014-07-07 NOTE — Assessment & Plan Note (Signed)
Asymptomatic, but recent exposure to trichomonas tested positive sexual partner (completed treatment) - Recent OV 05/22/14 with STD check: non-reactive HIV / RPR, negative GC/Chlam urine cytology  Plan: 1. Empiric treatment with Metronidazole  BID x 7 days, advised avoid alcohol 2. No sexual intercourse x 7-10 days, recommend wearing condoms 3. Consider partner to repeat testing prior to resuming intercourse 4. RTC PRN

## 2014-07-07 NOTE — Patient Instructions (Signed)
Dear Hunter Newman, Thank you for coming in to clinic today.  1. Since you have most likely been exposed to Trichomonas, we will go ahead and treat you with the antibiotic. Also, since you have been tested for other STDs recently we will not repeat those tests 2. Sent prescription for Metronidazole (Flagyl)  tablets take 1 twice daily for 7 days (with food, and avoid alcohol while on this medicine, it will make you sick) 3. No sexual intercourse until completed course, recommend waiting 7 to 10 days total before resuming intercourse. Wearing condoms will reduce risk of transmission.  Please schedule a follow-up appointment with Dr. Benjamin Stain as needed.  If you have any other questions or concerns, please feel free to call the clinic to contact me. You may also schedule an earlier appointment if necessary.  However, if your symptoms get significantly worse, please go to the Emergency Department to seek immediate medical attention.  Saralyn Pilar, DO [facility]   Trichomoniasis Trichomoniasis is an infection caused by an organism called Trichomonas. The infection can affect both women and men. In women, the outer male genitalia and the vagina are affected. In men, the penis is mainly affected, but the prostate and other reproductive organs can also be involved. Trichomoniasis is a sexually transmitted infection (STI) and is most often passed to another person through sexual contact.  RISK FACTORS  Having unprotected sexual intercourse.  Having sexual intercourse with an infected partner. SIGNS AND SYMPTOMS  Symptoms of trichomoniasis in women include:  Abnormal gray-green frothy vaginal discharge.  Itching and irritation of the vagina.  Itching and irritation of the area outside the vagina. Symptoms of trichomoniasis in men include:   Penile discharge with or without pain.  Pain during urination. This results from inflammation of the  urethra. DIAGNOSIS  Trichomoniasis may be found during a Pap test or physical exam. Your health care provider may use one of the following methods to help diagnose this infection:  Examining vaginal discharge under a microscope. For men, urethral discharge would be examined.  Testing the pH of the vagina with a test tape.  Using a vaginal swab test that checks for the Trichomonas organism. A test is available that provides results within a few minutes.  Doing a culture test for the organism. This is not usually needed. TREATMENT   You may be given medicine to fight the infection. Women should inform their health care provider if they could be or are pregnant. Some medicines used to treat the infection should not be taken during pregnancy.  Your health care provider may recommend over-the-counter medicines or creams to decrease itching or irritation.  Your sexual partner will need to be treated if infected. HOME CARE INSTRUCTIONS   Take medicines only as directed by your health care provider.  Take over-the-counter medicine for itching or irritation as directed by your health care provider.  Do not have sexual intercourse while you have the infection.  Women should not douche or wear tampons while they have the infection.  Discuss your infection with your partner. Your partner may have gotten the infection from you, or you may have gotten it from your partner.  Have your sex partner get examined and treated if necessary.  Practice safe, informed, and protected sex.  See your health care provider for other STI testing. SEEK MEDICAL CARE IF:   You still have symptoms after you finish your medicine.  You develop abdominal pain.  You have pain when you  urinate.  You have bleeding after sexual intercourse.  You develop a rash.  Your medicine makes you sick or makes you throw up (vomit). MAKE SURE YOU:  Understand these instructions.  Will watch your condition.  Will  get help right away if you are not doing well or get worse. Document Released: 03/22/2001 Document Revised: 02/10/2014 Document Reviewed: 07/08/2013 [facility] Patient Information 2015 [place], [place]. This information is not intended to replace advice given to you by your health care provider. Make sure you discuss any questions you have with your health care provider.

## 2014-07-07 NOTE — Progress Notes (Signed)
   Subjective:    Patient ID: Hunter Newman, male    DOB: [birth year], 33 y.o.   MRN: [id number]  Patient presents for a same day appointment.  HPI  STD CHECK / TRICHOMONIASIS EXPOSURE: - Last OV 05/22/14 evaluated for rash and tested for multiple STDs, results with non-reactive HIV / RPR testing, and negative GC/Chlamydia urine cytology - Current sexual history with 1 partner, no condom use. States that his partner (currently pregnant x 2-3 months) was recently diagnosed about 1 week ago with Trichomoniasis and treated with a one time antibiotic dose at that time. The patient denies any sexual contact within the past week since his partner had been diagnosed, and is concerned that he may also have it as well and would like to get checked out. Last intercourse 1.5 weeks ago. - Hx positive Chlamydia in 2012 - Denies any fevers/chills, dysuria, discharge, abdominal pain   I have reviewed and updated the following as appropriate: allergies and current medications  Social Hx: - Active smoker  Review of Systems  See above HPI    Objective:   Physical Exam  BP 153/89  Pulse 80  Temp(Src) 99.1 F (37.3 C) (Oral)  Wt 125 lb (56.7 kg)  Gen - well-appearing, NAD HEENT - oropharynx clear, MMM Neck - supple Heart - RRR, no murmurs heard Abd - soft, NTND, no masses, +active BS Skin - warm, dry, no rashes     Assessment & Plan:   See specific A&P problem list for details.

## 2015-03-16 ENCOUNTER — Emergency Department (HOSPITAL_COMMUNITY)
Admission: EM | Admit: 2015-03-16 | Discharge: 2015-03-16 | Disposition: A | Discharge status: Home / Self Care | Payer: BLUE CROSS/BLUE SHIELD | Attending: Emergency Medicine | Admitting: Emergency Medicine

## 2015-03-16 ENCOUNTER — Encounter (HOSPITAL_COMMUNITY): Payer: Self-pay | Admitting: Emergency Medicine

## 2015-03-16 DIAGNOSIS — Z8709 Personal history of other diseases of the respiratory system: Secondary | ICD-10-CM | POA: Insufficient documentation

## 2015-03-16 DIAGNOSIS — Z792 Long term (current) use of antibiotics: Secondary | ICD-10-CM | POA: Diagnosis not present

## 2015-03-16 DIAGNOSIS — Z720 Tobacco use: Secondary | ICD-10-CM | POA: Diagnosis not present

## 2015-03-16 DIAGNOSIS — H5712 Ocular pain, left eye: Secondary | ICD-10-CM | POA: Diagnosis present

## 2015-03-16 DIAGNOSIS — H578 Other specified disorders of eye and adnexa: Secondary | ICD-10-CM | POA: Diagnosis not present

## 2015-03-16 DIAGNOSIS — H05222 Edema of left orbit: Secondary | ICD-10-CM | POA: Insufficient documentation

## 2015-03-16 DIAGNOSIS — Z79899 Other long term (current) drug therapy: Secondary | ICD-10-CM | POA: Diagnosis not present

## 2015-03-16 DIAGNOSIS — H579 Unspecified disorder of eye and adnexa: Secondary | ICD-10-CM

## 2015-03-16 MED ORDER — ERYTHROMYCIN 5 MG/GM OP OINT: TOPICAL_OINTMENT | OPHTHALMIC | Status: DC

## 2015-03-16 MED ORDER — FLUORESCEIN SODIUM 1 MG OP STRP
1.0000 | ORAL_STRIP | Freq: Once | OPHTHALMIC | Status: AC
  Administered 2015-03-16: 1 via OPHTHALMIC
  Filled 2015-03-16: qty 1

## 2015-03-16 NOTE — ED Provider Notes (Signed)
CSN: [id number]     Arrival date & time 03/16/15  1605 History  This chart was scribed for non-physician practitioner, Catha GosselinHanna Patel-Mills, working with Gerhard Munchobert Lockwood, MD by Richarda Overlieichard Holland, ED Scribe. This patient was seen in room WTR8/WTR8 and the patient's care was started at 4:31 PM.  Chief Complaint  Patient presents with  . Eye Pain    pain in l/eye x 8 hours   The history is provided by the patient. No language interpreter was used.   HPI Comments: Hunter Newman is a 34 y.o. male who presents to the Emergency Department complaining of left eye irritation that started this morning when he woke up and felt like something was in his left eye. Pt says he went to work and his left eye started swelling. Pt states that when he went to sleep last night he was experiencing no symptoms. He states that looking in different directions and blinking aggravates his left eye discomfort. Pt states he does not wear contact lenses or glasses and reports no hx eye problems. Pt reports no sick contacts with similar symptoms. He denies fevers, sore throat or ear pain.   Past Medical History  Diagnosis Date  . Tobacco abuse   . Marijuana abuse   . Chronic bronchitis    Past Surgical History  Procedure Laterality Date  . Eye surgery Left     lac repaired  . Eye surgery    . Dental surgery     Family History  Problem Relation Age of Onset  . Diabetes Father   . Cancer Father    History  Substance Use Topics  . Smoking status: Current Every Day Smoker -- 0.30 packs/day    Types: Cigarettes  . Smokeless tobacco: Not on file  . Alcohol Use: Yes     Comment: occasionally    Review of Systems  Constitutional: Negative for fever.  HENT: Negative for ear pain and sore throat.   Eyes: Positive for pain, redness and itching.   Allergies  Review of patient's allergies indicates no known allergies.  Home Medications   Prior to Admission medications   Medication Sig Start Date End Date Taking?  Authorizing Provider  albuterol (PROVENTIL HFA;VENTOLIN HFA) 108 (90 BASE) MCG/ACT inhaler Inhale 2 puffs into the lungs every 6 (six) hours as needed for wheezing or shortness of breath.    Historical Provider, MD  cetirizine (ZYRTEC) 10 MG tablet Take 1 tablet (10 mg total) by mouth daily. 05/27/14   Myra RudeJeremy E Schmitz, MD  erythromycin ophthalmic ointment Place a 1/2 inch ribbon of ointment into the lower eyelid. 03/16/15   Glenette Bookwalter Patel-Mills, PA-C  metroNIDAZOLE (FLAGYL) 500 MG tablet Take 1 tablet (500 mg total) by mouth 2 (two) times daily. Do not drink alcohol while on this medicine. 07/07/14   Smitty CordsAlexander J Karamalegos, DO  traMADol (ULTRAM) 50 MG tablet Take 1 tablet (50 mg total) by mouth every 6 (six) hours as needed for pain. 04/26/13   Garlon HatchetLisa M Sanders, PA-C   BP 129/77 mmHg  Pulse 72  Temp(Src) 98.5 F (36.9 C) (Oral)  Resp 18  SpO2 100% Physical Exam  Constitutional: He is oriented to person, place, and time. He appears well-developed and well-nourished.  HENT:  Head: Normocephalic and atraumatic.  Eyes: Conjunctivae and EOM are normal. Pupils are equal, round, and reactive to light. Lids are everted and swept, no foreign bodies found. Right eye exhibits normal extraocular motion. Left eye exhibits normal extraocular motion.  Mild left superior orbital  edema involving the lid. Active left eye tearing. No left eye conjunctival hemorrhage. No matting. No foreign body seen with upper or lower lid eversion.   Fluorescein exam: No corneal abrasion or fluorescein uptake observed with the lamp.   Neck: Normal range of motion.  Cardiovascular: Normal rate.   Pulmonary/Chest: Effort normal. No respiratory distress.  Abdominal: He exhibits no distension.  Neurological: He is alert and oriented to person, place, and time.  Skin: Skin is warm and dry.  Psychiatric: He has a normal mood and affect. His behavior is normal.  Nursing note and vitals reviewed.   ED Course  Procedures   DIAGNOSTIC  STUDIES: Oxygen Saturation is 100% on RA, normal by my interpretation.    COORDINATION OF CARE: 4:34 PM Discussed treatment plan with pt at bedside and pt agreed to plan.   Labs Review Labs Reviewed - No data to display  Imaging Review No results found.   EKG Interpretation None      MDM   Final diagnoses:  Sensation of foreign body in eye  Patient presents for left eye foreign body sensation.  The fluorescein exam was negative for corneal abrasion.  I do not suspect orbital or preorbital cellulitis.  I do not suspect bacterial conjunctivitis or a globe injury.  This could be a superficial abrasion that cannot be seen by the florescent lamp. I have given the patient a prescription for erythromycin ointment and opthalmology referral.   I personally performed the services described in this documentation, which was scribed in my presence. The recorded information has been reviewed and is accurate.      Catha Gosselin, PA-C 03/16/15 2247  Gerhard Munch, MD 03/16/15 2350

## 2015-03-16 NOTE — ED Notes (Signed)
Pt reports pain in l/eye  x8 hours. Denies trauma. Drainage from eye is clear, eye is red with slight swelling

## 2015-03-24 ENCOUNTER — Inpatient Hospital Stay (HOSPITAL_COMMUNITY)
Admission: EM | Admit: 2015-03-24 | Discharge: 2015-03-31 | DRG: 165 | Disposition: A | Discharge status: Home / Self Care | Payer: BLUE CROSS/BLUE SHIELD | Attending: Cardiothoracic Surgery | Admitting: Cardiothoracic Surgery

## 2015-03-24 ENCOUNTER — Emergency Department (HOSPITAL_COMMUNITY): Payer: BLUE CROSS/BLUE SHIELD

## 2015-03-24 ENCOUNTER — Encounter (HOSPITAL_COMMUNITY): Payer: Self-pay | Admitting: *Deleted

## 2015-03-24 DIAGNOSIS — J9382 Other air leak: Secondary | ICD-10-CM | POA: Diagnosis not present

## 2015-03-24 DIAGNOSIS — J939 Pneumothorax, unspecified: Secondary | ICD-10-CM

## 2015-03-24 DIAGNOSIS — F17210 Nicotine dependence, cigarettes, uncomplicated: Secondary | ICD-10-CM | POA: Diagnosis present

## 2015-03-24 DIAGNOSIS — J9311 Primary spontaneous pneumothorax: Secondary | ICD-10-CM | POA: Diagnosis not present

## 2015-03-24 DIAGNOSIS — J982 Interstitial emphysema: Secondary | ICD-10-CM | POA: Diagnosis present

## 2015-03-24 DIAGNOSIS — J9383 Other pneumothorax: Secondary | ICD-10-CM | POA: Diagnosis present

## 2015-03-24 DIAGNOSIS — R079 Chest pain, unspecified: Secondary | ICD-10-CM | POA: Diagnosis present

## 2015-03-24 DIAGNOSIS — Z4682 Encounter for fitting and adjustment of non-vascular catheter: Secondary | ICD-10-CM

## 2015-03-24 DIAGNOSIS — Z9689 Presence of other specified functional implants: Secondary | ICD-10-CM

## 2015-03-24 DIAGNOSIS — Z419 Encounter for procedure for purposes other than remedying health state, unspecified: Secondary | ICD-10-CM

## 2015-03-24 HISTORY — PX: CHEST TUBE INSERTION: SHX231

## 2015-03-24 HISTORY — DX: Other pneumothorax: J93.83

## 2015-03-24 LAB — CBC
HCT: 39.9 % (ref 39.0–52.0)
HEMOGLOBIN: 13.9 g/dL (ref 13.0–17.0)
MCH: 34 pg (ref 26.0–34.0)
MCHC: 34.8 g/dL (ref 30.0–36.0)
MCV: 97.6 fL (ref 78.0–100.0)
PLATELETS: 228 10*3/uL (ref 150–400)
RBC: 4.09 MIL/uL — AB (ref 4.22–5.81)
RDW: 13 % (ref 11.5–15.5)
WBC: 7 10*3/uL (ref 4.0–10.5)

## 2015-03-24 LAB — BASIC METABOLIC PANEL
Anion gap: 10 (ref 5–15)
BUN: 16 mg/dL (ref 6–20)
CALCIUM: 9.4 mg/dL (ref 8.9–10.3)
CO2: 21 mmol/L — ABNORMAL LOW (ref 22–32)
Chloride: 108 mmol/L (ref 101–111)
Creatinine, Ser: 1.06 mg/dL (ref 0.61–1.24)
GLUCOSE: 128 mg/dL — AB (ref 65–99)
Potassium: 4 mmol/L (ref 3.5–5.1)
Sodium: 139 mmol/L (ref 135–145)

## 2015-03-24 LAB — I-STAT TROPONIN, ED: Troponin i, poc: 0 ng/mL (ref 0.00–0.08)

## 2015-03-24 LAB — D-DIMER, QUANTITATIVE: D-Dimer, Quant: <0.27 ug/mL-FEU (ref 0.00–0.48)

## 2015-03-24 MED ORDER — SODIUM CHLORIDE 0.9 % IJ SOLN: 3.0000 mL | INTRAMUSCULAR | Status: DC | PRN

## 2015-03-24 MED ORDER — SODIUM CHLORIDE 0.9 % IJ SOLN
3.0000 mL | Freq: Two times a day (BID) | INTRAMUSCULAR | Status: DC
  Administered 2015-03-24: 3 mL via INTRAVENOUS

## 2015-03-24 MED ORDER — MORPHINE SULFATE 4 MG/ML IJ SOLN
4.0000 mg | Freq: Once | INTRAMUSCULAR | Status: AC
  Administered 2015-03-24: 4 mg via INTRAVENOUS
  Filled 2015-03-24: qty 1

## 2015-03-24 MED ORDER — ONDANSETRON HCL 4 MG/2ML IJ SOLN: 4.0000 mg | Freq: Four times a day (QID) | INTRAMUSCULAR | Status: DC | PRN

## 2015-03-24 MED ORDER — MORPHINE SULFATE 4 MG/ML IJ SOLN: 4.0000 mg | INTRAMUSCULAR | Status: DC | PRN

## 2015-03-24 MED ORDER — LIDOCAINE-EPINEPHRINE 2 %-1:100000 IJ SOLN: 20.0000 mL | Freq: Once | INTRAMUSCULAR | Status: DC

## 2015-03-24 MED ORDER — KETOROLAC TROMETHAMINE 15 MG/ML IJ SOLN
15.0000 mg | Freq: Four times a day (QID) | INTRAMUSCULAR | Status: AC
  Administered 2015-03-24 – 2015-03-26 (×8): 15 mg via INTRAVENOUS
  Filled 2015-03-24 (×8): qty 1

## 2015-03-24 MED ORDER — CEFAZOLIN SODIUM 1-5 GM-% IV SOLN
1.0000 g | Freq: Three times a day (TID) | INTRAVENOUS | Status: DC
  Administered 2015-03-24 – 2015-03-27 (×9): 1 g via INTRAVENOUS
  Filled 2015-03-24 (×12): qty 50

## 2015-03-24 MED ORDER — SODIUM CHLORIDE 0.9 % IJ SOLN
3.0000 mL | Freq: Two times a day (BID) | INTRAMUSCULAR | Status: DC
  Administered 2015-03-24 – 2015-03-25 (×3): 3 mL via INTRAVENOUS

## 2015-03-24 MED ORDER — SODIUM CHLORIDE 0.9 % IV SOLN: 250.0000 mL | INTRAVENOUS | Status: DC | PRN

## 2015-03-24 MED ORDER — BUDESONIDE-FORMOTEROL FUMARATE 160-4.5 MCG/ACT IN AERO
2.0000 | INHALATION_SPRAY | Freq: Two times a day (BID) | RESPIRATORY_TRACT | Status: DC
  Administered 2015-03-24 – 2015-03-31 (×14): 2 via RESPIRATORY_TRACT
  Filled 2015-03-24 (×2): qty 6

## 2015-03-24 MED ORDER — SODIUM CHLORIDE 0.9 % IV BOLUS (SEPSIS)
1000.0000 mL | Freq: Once | INTRAVENOUS | Status: AC
  Administered 2015-03-24: 1000 mL via INTRAVENOUS

## 2015-03-24 MED ORDER — BISACODYL 5 MG PO TBEC
5.0000 mg | DELAYED_RELEASE_TABLET | Freq: Every day | ORAL | Status: DC | PRN
  Filled 2015-03-24: qty 1

## 2015-03-24 MED ORDER — NAPHAZOLINE-PHENIRAMINE 0.025-0.3 % OP SOLN
2.0000 [drp] | Freq: Four times a day (QID) | OPHTHALMIC | Status: DC | PRN
  Filled 2015-03-24: qty 5

## 2015-03-24 MED ORDER — SENNA 8.6 MG PO TABS
1.0000 | ORAL_TABLET | Freq: Two times a day (BID) | ORAL | Status: DC
  Filled 2015-03-24 (×8): qty 1

## 2015-03-24 MED ORDER — MAGNESIUM CITRATE PO SOLN: 1.0000 | Freq: Once | ORAL | Status: AC | PRN

## 2015-03-24 MED ORDER — LIDOCAINE-EPINEPHRINE (PF) 2 %-1:200000 IJ SOLN
20.0000 mL | Freq: Once | INTRAMUSCULAR | Status: AC
  Administered 2015-03-24: 20 mL
  Filled 2015-03-24: qty 20

## 2015-03-24 MED ORDER — FENTANYL CITRATE (PF) 100 MCG/2ML IJ SOLN
100.0000 ug | Freq: Once | INTRAMUSCULAR | Status: AC
  Administered 2015-03-24: 50 ug via INTRAVENOUS
  Filled 2015-03-24: qty 2

## 2015-03-24 MED ORDER — CEFAZOLIN SODIUM 1 G IJ SOLR
1.0000 g | Freq: Three times a day (TID) | INTRAMUSCULAR | Status: DC
  Filled 2015-03-24 (×3): qty 10

## 2015-03-24 MED ORDER — ACETAMINOPHEN 650 MG RE SUPP
650.0000 mg | Freq: Four times a day (QID) | RECTAL | Status: DC | PRN
Start: 2015-03-24 — End: 2015-03-27

## 2015-03-24 MED ORDER — PNEUMOCOCCAL VAC POLYVALENT 25 MCG/0.5ML IJ INJ
0.5000 mL | INJECTION | INTRAMUSCULAR | Status: DC
  Filled 2015-03-24: qty 0.5

## 2015-03-24 MED ORDER — MIDAZOLAM HCL 2 MG/2ML IJ SOLN
4.0000 mg | Freq: Once | INTRAMUSCULAR | Status: AC
  Administered 2015-03-24: 2 mg via INTRAVENOUS
  Filled 2015-03-24: qty 4

## 2015-03-24 MED ORDER — ZOLPIDEM TARTRATE 5 MG PO TABS: 5.0000 mg | ORAL_TABLET | Freq: Every evening | ORAL | Status: DC | PRN

## 2015-03-24 MED ORDER — ACETAMINOPHEN 325 MG PO TABS: 650.0000 mg | ORAL_TABLET | Freq: Four times a day (QID) | ORAL | Status: DC | PRN

## 2015-03-24 MED ORDER — OXYCODONE HCL 5 MG PO TABS
10.0000 mg | ORAL_TABLET | ORAL | Status: DC | PRN
  Administered 2015-03-24 – 2015-03-27 (×10): 10 mg via ORAL
  Filled 2015-03-24 (×10): qty 2

## 2015-03-24 MED ORDER — DOCUSATE SODIUM 100 MG PO CAPS
100.0000 mg | ORAL_CAPSULE | Freq: Two times a day (BID) | ORAL | Status: DC
  Administered 2015-03-25 – 2015-03-26 (×2): 100 mg via ORAL
  Filled 2015-03-24 (×6): qty 1

## 2015-03-24 MED ORDER — ONDANSETRON HCL 4 MG PO TABS: 4.0000 mg | ORAL_TABLET | Freq: Four times a day (QID) | ORAL | Status: DC | PRN

## 2015-03-24 NOTE — ED Notes (Signed)
Pt in from home via [facility], pt c/o mid non radiating CP onset today with non productive cough, pt denies N/V/D, pt rcvd x 2 SL nitro with pain relief from 9/10 to 4/10, pt rcvd 324 mg ASA, A&Ox4, follows commands, speaks in complete sentences

## 2015-03-24 NOTE — ED Provider Notes (Signed)
CSN: [id number]     Arrival date & time 03/24/15  0711 History   First MD Initiated Contact with Patient 03/24/15 0720     Chief Complaint  Patient presents with  . Chest Pain     (Consider location/radiation/quality/duration/timing/severity/associated sxs/prior Treatment) HPI 34 year old male presents with acute chest pain that started when he woke up at 6 AM. The patient states the pain is sharp and radiates through to his back. Has had a nonproductive cough and shortness of breath. States the pain seemed to start his left flank but now is in his mid chest and radiates to his back. Rates the pain is severe. No nausea, vomiting. Patient has not had any fevers or chills. The pain does worsen with inspiration. The patient is a smoker. No leg pain, swelling, or recent surgery or travel.  Past Medical History  Diagnosis Date  . Tobacco abuse   . Marijuana abuse   . Chronic bronchitis    Past Surgical History  Procedure Laterality Date  . Eye surgery Left     lac repaired  . Eye surgery    . Dental surgery     Family History  Problem Relation Age of Onset  . Diabetes Father   . Cancer Father    History  Substance Use Topics  . Smoking status: Current Every Day Smoker -- 0.30 packs/day    Types: Cigarettes  . Smokeless tobacco: Not on file  . Alcohol Use: Yes     Comment: occasionally    Review of Systems  Constitutional: Negative for fever.  Respiratory: Positive for cough and shortness of breath.   Cardiovascular: Positive for chest pain. Negative for leg swelling.  All other systems reviewed and are negative.     Allergies  Review of patient's allergies indicates no known allergies.  Home Medications   Prior to Admission medications   Medication Sig Start Date End Date Taking? Authorizing Provider  albuterol (PROVENTIL HFA;VENTOLIN HFA) 108 (90 BASE) MCG/ACT inhaler Inhale 2 puffs into the lungs every 6 (six) hours as needed for wheezing or shortness of breath.     Historical Provider, MD  cetirizine (ZYRTEC) 10 MG tablet Take 1 tablet (10 mg total) by mouth daily. 05/27/14   Myra Rude, MD  erythromycin ophthalmic ointment Place a 1/2 inch ribbon of ointment into the lower eyelid. 03/16/15   Hanna Patel-Mills, PA-C  metroNIDAZOLE (FLAGYL) 500 MG tablet Take 1 tablet (500 mg total) by mouth 2 (two) times daily. Do not drink alcohol while on this medicine. 07/07/14   Smitty Cords, DO  traMADol (ULTRAM) 50 MG tablet Take 1 tablet (50 mg total) by mouth every 6 (six) hours as needed for pain. 04/26/13   Garlon Hatchet, PA-C   BP 116/80 mmHg  Pulse 106  Resp 26  Ht 5\' 7"  (1.702 m)  Wt 130 lb (58.968 kg)  BMI 20.36 kg/m2  SpO2 97% Physical Exam  Constitutional: He is oriented to person, place, and time. He appears well-developed and well-nourished.  HENT:  Head: Normocephalic and atraumatic.  Right Ear: External ear normal.  Left Ear: External ear normal.  Nose: Nose normal.  Eyes: Right eye exhibits no discharge. Left eye exhibits no discharge.  Neck: Neck supple.  Cardiovascular: Regular rhythm, normal heart sounds and intact distal pulses.  Tachycardia present.   Pulmonary/Chest: Effort normal. No accessory muscle usage. Tachypnea noted. No respiratory distress. He has decreased breath sounds in the left upper field, the left middle field and the  left lower field.  Abdominal: Soft. There is no tenderness.  Musculoskeletal: He exhibits no edema.  Neurological: He is alert and oriented to person, place, and time.  Skin: Skin is warm and dry.  Nursing note and vitals reviewed.   ED Course  Procedures (including critical care time) Labs Review Labs Reviewed  BASIC METABOLIC PANEL - Abnormal; Notable for the following:    CO2 21 (*)    Glucose, Bld 128 (*)    All other components within normal limits  CBC - Abnormal; Notable for the following:    RBC 4.09 (*)    All other components within normal limits  D-DIMER, QUANTITATIVE  (NOT AT [facility])  Rosezena Sensor, ED    Imaging Review Dg Chest Portable 1 View  03/24/2015   CLINICAL DATA:  Chest pain, chronic bronchitis, significant acute difficulty breathing  EXAM: PORTABLE CHEST - 1 VIEW  COMPARISON:  None.  FINDINGS: Cardiomediastinal silhouette is unremarkable. There is very large left pneumothorax with collapse of left lung. The right lung is clear.  IMPRESSION: Very large left pneumothorax with collapse of left lung. Critical Value/emergent results were called by telephone at the time of interpretation on 03/24/2015 at 8:42 am to Dr. Pricilla Loveless , who verbally acknowledged these results.   Electronically Signed   By: Natasha Mead M.D.   On: 03/24/2015 08:43     EKG Interpretation   Date/Time:  Tuesday March 24 2015 07:15:25 EDT Ventricular Rate:  106 PR Interval:  157 QRS Duration: 84 QT Interval:  331 QTC Calculation: 439 R Axis:   0 Text Interpretation:  Sinus tachycardia Anterior infarct, old No old  tracing to compare Confirmed by Aylan Bayona  MD, Gregori Abril ([id number]) on 03/24/2015  7:25:59 AM      MDM   Final diagnoses:  Spontaneous pneumothorax    Patient has evidence of a large left spontaneous pneumothorax. Pain improved with morphine. Not hypoxic but feels better with supplemental nasal cannula oxygen. He is hemodynamically stable despite his very large pneumothorax. There is no evidence of tension physiology. Consult in CT surgery, Dr. Morton Peters requests holding on chest tube and he will come down to ED to do it himself.     Pricilla Loveless, MD 03/24/15 1013

## 2015-03-24 NOTE — Progress Notes (Signed)
Utilization review completed.  

## 2015-03-24 NOTE — ED Notes (Signed)
Verbal order for pt to receive 25 mcg Fentanyl, pt received medication, medication did not scan as it was completed earlier with comment re: partial dosage

## 2015-03-24 NOTE — ED Notes (Signed)
Pt tolerated chest tube insertion well, pt vitals wnl during procedure, this RN remained with pt during the procedure & for 15 mins post procedure & pt at baseline, vitals remain WNL, family at bedside, will continue to monitor pt

## 2015-03-24 NOTE — Consult Note (Signed)
[address]       [place] [address]             [phone]        Hondo Nanda [facility] Medical Record #[id number] Date of Birth: [birth year]  Referring: No ref. provider found Primary Care: Simone Curia, MD  Chief Complaint:    Chief Complaint  Patient presents with  . Chest Pain  patient examined , CXR images reviewed and patient d/w ED physician- 80% Left spontaneous pneumothorax  History of Present Illness:     34 yo AA male with asthenic body habitus presents with first time episode of L spontaneous pntx- no recent trauma or violent coughing or strenuous activity. Active smoker,1/2 PPD. Works for [employer]. No fever , wt loss, hemoptysis  L 20 F chest tube placed in ED with immediate exit of air from L chest Small air leak thru pleurovac post tube placement   Current Activity/ Functional Status: Normal Works full time   Zubrod Score: At the time of surgery this patient's most appropriate activity status/level should be described as: [x]     0    Normal activity, no symptoms []     1    Restricted in physical strenuous activity but ambulatory, able to do out light work []     2    Ambulatory and capable of self care, unable to do work activities, up and about                 more than 50%  Of the time                            []     3    Only limited self care, in bed greater than 50% of waking hours []     4    Completely disabled, no self care, confined to bed or chair []     5    Moribund  Past Medical History  Diagnosis Date  . Tobacco abuse   . Marijuana abuse   . Chronic bronchitis     Past Surgical History  Procedure Laterality Date  . Eye surgery Left     lac repaired  . Eye surgery    . Dental surgery      History  Smoking status  . Current Every Day Smoker -- 0.30 packs/day  . Types: Cigarettes  Smokeless tobacco  . Not on file   History  Alcohol Use  . Yes    Comment: occasionally    History   Social History    . Marital Status: Single    Spouse Name: N/A  . Number of Children: N/A  . Years of Education: N/A   Occupational History  . Not on file.   Social History Main Topics  . Smoking status: Current Every Day Smoker -- 0.30 packs/day    Types: Cigarettes  . Smokeless tobacco: Not on file  . Alcohol Use: Yes     Comment: occasionally  . Drug Use: No  . Sexual Activity: Yes   Other Topics Concern  . Not on file   Social History Narrative    No Known Allergies  Current Facility-Administered Medications  Medication Dose Route Frequency Provider Last Rate Last Dose  . 0.9 %  sodium chloride infusion  250 mL Intravenous PRN Kerin Perna, MD      . acetaminophen (TYLENOL) tablet 650 mg  650 mg  Oral Q6H PRN Kerin Perna, MD       Or  . acetaminophen (TYLENOL) suppository 650 mg  650 mg Rectal Q6H PRN Kerin Perna, MD      . bisacodyl (DULCOLAX) EC tablet 5 mg  5 mg Oral Daily PRN Kerin Perna, MD      . budesonide-formoterol [facility]) 160-4.5 MCG/ACT inhaler 2 puff  2 puff Inhalation BID Kerin Perna, MD      . docusate sodium (COLACE) capsule 100 mg  100 mg Oral BID Kerin Perna, MD      . ketorolac (TORADOL) 15 MG/ML injection 15 mg  15 mg Intravenous 4 times per day Kerin Perna, MD      . magnesium citrate solution 1 Bottle  1 Bottle Oral Once PRN Kerin Perna, MD      . morphine 4 MG/ML injection 4 mg  4 mg Intravenous Q3H PRN Kerin Perna, MD      . ondansetron [facility]) tablet 4 mg  4 mg Oral Q6H PRN Kerin Perna, MD       Or  . ondansetron [facility]) injection 4 mg  4 mg Intravenous Q6H PRN Kerin Perna, MD      . oxyCODONE (Oxy IR/ROXICODONE) immediate release tablet 10 mg  10 mg Oral Q4H PRN Kerin Perna, MD      . [facility]) tablet 8.6 mg  1 tablet Oral BID Kerin Perna, MD      . sodium chloride 0.9 % injection 3 mL  3 mL Intravenous Q12H Kerin Perna, MD      . sodium chloride 0.9 % injection 3 mL  3 mL Intravenous Q12H Kerin Perna, MD      . sodium chloride 0.9 % injection 3 mL  3 mL Intravenous PRN Kerin Perna, MD      . zolpidem (AMBIEN) tablet 5 mg  5 mg Oral QHS PRN,MR X 1 Kerin Perna, MD       Current Outpatient Prescriptions  Medication Sig Dispense Refill  . albuterol (PROVENTIL HFA;VENTOLIN HFA) 108 (90 BASE) MCG/ACT inhaler Inhale 2 puffs into the lungs every 6 (six) hours as needed for wheezing or shortness of breath.    . cetirizine (ZYRTEC) 10 MG tablet Take 1 tablet (10 mg total) by mouth daily. 30 tablet 3  . erythromycin ophthalmic ointment Place a 1/2 inch ribbon of ointment into the lower eyelid. 1 g 0  . metroNIDAZOLE (FLAGYL) 500 MG tablet Take 1 tablet (500 mg total) by mouth 2 (two) times daily. Do not drink alcohol while on this medicine. 14 tablet 0  . traMADol (ULTRAM) 50 MG tablet Take 1 tablet (50 mg total) by mouth every 6 (six) hours as needed for pain. 15 tablet 0     (Not in a hospital admission)  Family History  Problem Relation Age of Onset  . Diabetes Father   . Cancer Father      Review of Systems:       Cardiac Review of Systems: Y or N  Chest Pain [  y  ]  Resting SOB Cove.Etienne   ] Exertional SOB  [ y ]  Pollyann Kennedy Cove.Etienne  ]   Pedal Edema [ n  ]    Palpitations Milo.Brash  ] Syncope  [ n ]   Presyncope [ n  ]  General Review of Systems: [Y] = yes [  ]=no Constitional: recent weight change [  ];  anorexia [  ]; fatigue [  ]; nausea [  ]; night sweats [  ]; fever [  ]; or chills [  ]   Inflammation of eyes on eye drops currently                                                            Dental: poor dentition[  ]; Last Dentist visit: 1 yr  Eye : blurred vision [  ]; diplopia [   ]; vision changes [  ];  Amaurosis fugax[  ]; Resp: cough Cove.Etienne  ];  wheezing[  ];  hemoptysis[  ]; shortness of breath[ y ]; paroxysmal nocturnal dyspnea[  ]; dyspnea on exertion[  y]; or orthopnea[y  ];  GI:  gallstones[  ], vomiting[  ];  dysphagia[  ]; melena[  ];  hematochezia [  ]; heartburn[  ];   Hx  of  Colonoscopy[  ]; GU: kidney stones [  ]; hematuria[  ];   dysuria [  ];  nocturia[  ];  history of     obstruction [  ]; urinary frequency [  ]             Skin: rash, swelling[  ];, hair loss[  ];  peripheral edema[  ];  or itching[  ]; Musculosketetal: myalgias[  ];  joint swelling[  ];  joint erythema[  ];  joint pain[  ];  back pain[  ];  Heme/Lymph: bruising[  ];  bleeding[  ];  anemia[  ];  Neuro: TIA[  ];  headaches[  ];  stroke[  ];  vertigo[  ];  seizures[  ];   paresthesias[  ];  difficulty walking[  ];  Psych:depression[  ]; anxiety[  ];  Endocrine: diabetes[  ];  thyroid dysfunction[  ];  Immunizations: Flu [  ]; Pneumococcal[  ];  Other: RHD  Physical Exam: BP 121/74 mmHg  Pulse 96  Resp 24  Ht 5\' 7"  (1.702 m)  Wt 130 lb (58.968 kg)  BMI 20.36 kg/m2  SpO2 100%       Physical Exam  General: young AA male with resp sx's but no distress HEENT: Normocephalic pupils equal , dentition adequate Neck: Supple without JVD, adenopathy, or bruit no crepitus Chest: Clear to auscultation but dimminished breath sounds with wheezes on left , no rhonchi, no tenderness             or deformity Cardiovascular: Regular rate and rhythm, no murmur, no gallop, peripheral pulses             palpable in all extremities Abdomen:  Soft, nontender, no palpable mass or organomegaly Extremities: Warm, well-perfused, no clubbing cyanosis edema or tenderness,              no venous stasis changes of the legs Rectal/GU: Deferred Neuro: Grossly non--focal and symmetrical throughout Skin: Clean and dry without rash or ulceration   Diagnostic Studies & Laboratory data:     Recent Radiology Findings:   Dg Chest Portable 1 View  03/24/2015   CLINICAL DATA:  Chest pain, chronic bronchitis, significant acute difficulty breathing  EXAM: PORTABLE CHEST - 1 VIEW  COMPARISON:  None.  FINDINGS: Cardiomediastinal silhouette is unremarkable. There is very large left pneumothorax with collapse of left  lung. The right  lung is clear.  IMPRESSION: Very large left pneumothorax with collapse of left lung. Critical Value/emergent results were called by telephone at the time of interpretation on 03/24/2015 at 8:42 am to Dr. Pricilla Loveless , who verbally acknowledged these results.   Electronically Signed   By: Natasha Mead M.D.   On: 03/24/2015 08:43     I have independently reviewed the above radiologic studies.  Recent Lab Findings: Lab Results  Component Value Date   WBC 7.0 03/24/2015   HGB 13.9 03/24/2015   HCT 39.9 03/24/2015   PLT 228 03/24/2015   GLUCOSE 128* 03/24/2015   NA 139 03/24/2015   K 4.0 03/24/2015   CL 108 03/24/2015   CREATININE 1.06 03/24/2015   BUN 16 03/24/2015   CO2 21* 03/24/2015      Assessment / Plan:     spontaneuos L 80% PNTX- chest tube placed   Admit for chest tube therapy, daily CXR       @ 03/24/2015 10:07 AM      \

## 2015-03-25 ENCOUNTER — Inpatient Hospital Stay (HOSPITAL_COMMUNITY): Payer: BLUE CROSS/BLUE SHIELD

## 2015-03-25 LAB — CBC
HCT: 36.4 % — ABNORMAL LOW (ref 39.0–52.0)
Hemoglobin: 12.5 g/dL — ABNORMAL LOW (ref 13.0–17.0)
MCH: 33.8 pg (ref 26.0–34.0)
MCHC: 34.3 g/dL (ref 30.0–36.0)
MCV: 98.4 fL (ref 78.0–100.0)
Platelets: 207 10*3/uL (ref 150–400)
RBC: 3.7 MIL/uL — ABNORMAL LOW (ref 4.22–5.81)
RDW: 13.1 % (ref 11.5–15.5)
WBC: 7.9 10*3/uL (ref 4.0–10.5)

## 2015-03-25 LAB — BASIC METABOLIC PANEL
Anion gap: 7 (ref 5–15)
BUN: 13 mg/dL (ref 6–20)
CO2: 23 mmol/L (ref 22–32)
Calcium: 8.7 mg/dL — ABNORMAL LOW (ref 8.9–10.3)
Chloride: 106 mmol/L (ref 101–111)
Creatinine, Ser: 0.93 mg/dL (ref 0.61–1.24)
GFR calc Af Amer: >60 mL/min (ref >60)
GFR calc non Af Amer: >60 mL/min (ref >60)
Glucose, Bld: 112 mg/dL — ABNORMAL HIGH (ref 65–99)
Potassium: 4.3 mmol/L (ref 3.5–5.1)
Sodium: 136 mmol/L (ref 135–145)

## 2015-03-25 NOTE — Op Note (Signed)
NAME:  Hunter Newman, Hunter Newman              ACCOUNT NO.:  [id number]  MEDICAL RECORD NO.:  [id number]  LOCATION:  [id number]                        FACILITY:  [facility]  PHYSICIAN:  Kerin Perna, M.D.  DATE OF BIRTH:  Jul 21, 1981  DATE OF PROCEDURE:  03/24/2015 DATE OF DISCHARGE:                              OPERATIVE REPORT   OPERATION:  Placement of left chest tube.  SURGEON:  Kerin Perna, M.D.  PREOPERATIVE DIAGNOSIS:  80% spontaneous left pneumothorax.  POSTOPERATIVE DIAGNOSIS:  80% spontaneous left pneumothorax.  ANESTHESIA:  Local 1% lidocaine with IV conscious monitored sedation.  INDICATIONS:  The patient is a 34 year old African American male smoker, who presents to the ED with approximately 3 hours symptoms of left chest pain and shortness of breath which woke him while at sleep.  He denies any previous trauma, heavy exertion, coughing, or emesis.  He denies previous history of pneumothorax.  He is a slender young male, smoker one half pack per day.  Chest x-ray in the ED demonstrated 80% left pneumothorax and thoracic surgical evaluation was requested.  The patient denies any constitutional symptoms of fever, weight loss, and he specifically denies hemoptysis or violent emesis.  No pain except in the left posterior chest and shoulder area.  He denies any history of heart or lung problems.  No prior history of pneumonia.  No history of asthma as a child.  He works for [employer] and carries heavy packages routinely.  After I reviewed the x-ray and examined the patient, informed consent was obtained for left chest tube placement.  OPERATIVE PROCEDURE:  The patient was placed supine on the stretcher in the emergency department.  He was given intravenous medications after he signed his consent.  The left chest was prepped and draped as a sterile field.  A proper time-out was performed.  1% lidocaine was infiltrated in the fifth interspace in the anterior axillary line.  A small  incision was made.  Further lidocaine was infiltrated in the intercostal muscle. Using the hemostat, the pleural space was entered with immediate exit of air under pressure.  A 20-French chest tube was then placed through the incision and passed the apex of the pleural space.  It was connected with an underwater seal Pleur-Evac drainage system and a silk suture was used to secure the tube.  A sterile dressing was applied.  The Pleur-Evac showed persistent small air leak. A followup chest x-ray showed complete re-expansion of the lung, no residual pneumothorax but chest tube in good position.     Kerin Perna, M.D.     PV/MEDQ  D:  03/24/2015  T:  03/25/2015  Job:  [id number]

## 2015-03-25 NOTE — Progress Notes (Signed)
[address]       [place] [address]             [phone]               Subjective: "I feel a lot better." Comfortable, breathing stable.   Objective: Vital signs in last 24 hours: Patient Vitals for the past 24 hrs:  BP Temp Temp src Pulse Resp SpO2 Weight  03/25/15 0635 126/78 mmHg 98.5 F (36.9 C) Oral 64 18 100 % -  03/24/15 2014 118/71 mmHg 98.3 F (36.8 C) Oral 68 20 100 % -  03/24/15 1400 (!) 112/95 mmHg 98.6 F (37 C) Oral 74 (!) 22 100 % -  03/24/15 1107 134/86 mmHg 98.6 F (37 C) Oral 65 20 100 % 124 lb 12.5 oz (56.6 kg)  03/24/15 1046 - 98 F (36.7 C) Oral - - - -  03/24/15 1030 127/82 mmHg - - 82 24 100 % -  03/24/15 1015 124/74 mmHg - - 78 22 100 % -  03/24/15 1000 121/74 mmHg - - 96 24 100 % -  03/24/15 0958 - - - 92 26 100 % -  03/24/15 0956 - - - 92 25 100 % -  03/24/15 0953 - - - 97 (!) 29 99 % -  03/24/15 0950 - - - 99 (!) 27 99 % -  03/24/15 0949 129/73 mmHg - - 97 (!) 28 99 % -  03/24/15 0945 141/90 mmHg - - 107 (!) 37 100 % -  03/24/15 0943 - - - 104 25 100 % -  03/24/15 0941 - - - 93 22 100 % -  03/24/15 0938 - - - 90 24 99 % -  03/24/15 0935 - - - 82 17 100 % -  03/24/15 0933 - - - 82 25 100 % -  03/24/15 0930 138/92 mmHg - - 79 (!) 27 100 % -  03/24/15 0915 - - - 84 (!) 28 100 % -  03/24/15 0900 149/93 mmHg - - 86 (!) 31 100 % -   Current Weight  03/24/15 124 lb 12.5 oz (56.6 kg)     Intake/Output from previous day: 06/14 0701 - 06/15 0700 In: 1360 [P.O.:360; IV Piggyback:1000] Out: -     PHYSICAL EXAM:  Heart: RRR Lungs: Clear Chest tube: 1/7 air leak     Lab Results: CBC: Recent Labs  03/24/15 0720 03/25/15 0447  WBC 7.0 7.9  HGB 13.9 12.5*  HCT 39.9 36.4*  PLT 228 207   BMET:  Recent Labs  03/24/15 0720 03/25/15 0447  NA 139 136  K 4.0 4.3  CL 108 106  CO2 21* 23  GLUCOSE 128* 112*  BUN 16 13  CREATININE 1.06 0.93  CALCIUM 9.4 8.7*    PT/INR: No results for input(s):  LABPROT, INR in the last 72 hours.  CXR: COMPARISON: 03/24/2015 and earlier.  FINDINGS: Portable AP semi upright view at 0541 hours. Left chest tube remains in place. Left pneumothorax has resolved. Moderate volume left chest wall subcutaneous gas re - identified. Larger lung volumes. Normal cardiac size and mediastinal contours. Visualized tracheal air column is within normal limits. No confluent pulmonary opacity.  IMPRESSION: 1. Left chest tube in place. Resolved left pneumothorax. 2. No new cardiopulmonary abnormality.    Assessment/Plan: S/P  L CT for spontaneous ptx-  Lung re-expanded. Continue CT to suction today. Pain controlled, breathing stable, continue current care.   LOS: 1  day    Hunter Newman 03/25/2015

## 2015-03-26 ENCOUNTER — Encounter (HOSPITAL_COMMUNITY): Payer: Self-pay | Admitting: Radiology

## 2015-03-26 ENCOUNTER — Inpatient Hospital Stay (HOSPITAL_COMMUNITY): Payer: BLUE CROSS/BLUE SHIELD

## 2015-03-26 LAB — COMPREHENSIVE METABOLIC PANEL
ALT: 17 U/L (ref 17–63)
AST: 21 U/L (ref 15–41)
Albumin: 3.8 g/dL (ref 3.5–5.0)
Alkaline Phosphatase: 49 U/L (ref 38–126)
Anion gap: 7 (ref 5–15)
BUN: 13 mg/dL (ref 6–20)
CO2: 25 mmol/L (ref 22–32)
Calcium: 9.1 mg/dL (ref 8.9–10.3)
Chloride: 106 mmol/L (ref 101–111)
Creatinine, Ser: 0.93 mg/dL (ref 0.61–1.24)
GFR calc Af Amer: >60 mL/min (ref >60)
GFR calc non Af Amer: >60 mL/min (ref >60)
Glucose, Bld: 116 mg/dL — ABNORMAL HIGH (ref 65–99)
Potassium: 3.9 mmol/L (ref 3.5–5.1)
Sodium: 138 mmol/L (ref 135–145)
Total Bilirubin: 0.5 mg/dL (ref 0.3–1.2)
Total Protein: 6.6 g/dL (ref 6.5–8.1)

## 2015-03-26 LAB — BLOOD GAS, ARTERIAL
Acid-base deficit: 0.2 mmol/L (ref 0.0–2.0)
Bicarbonate: 23.9 mEq/L (ref 20.0–24.0)
Drawn by: 31297
FIO2: 0.21 %
O2 Saturation: 97.8 %
Patient temperature: 98.6
TCO2: 25.1 mmol/L (ref 0–100)
pCO2 arterial: 39 mmHg (ref 35.0–45.0)
pH, Arterial: 7.404 (ref 7.350–7.450)
pO2, Arterial: 96.1 mmHg (ref 80.0–100.0)

## 2015-03-26 LAB — TYPE AND SCREEN
ABO/RH(D): O POS
Antibody Screen: NEGATIVE

## 2015-03-26 LAB — CBC
HCT: 38.3 % — ABNORMAL LOW (ref 39.0–52.0)
Hemoglobin: 13.2 g/dL (ref 13.0–17.0)
MCH: 33.9 pg (ref 26.0–34.0)
MCHC: 34.5 g/dL (ref 30.0–36.0)
MCV: 98.5 fL (ref 78.0–100.0)
Platelets: 212 10*3/uL (ref 150–400)
RBC: 3.89 MIL/uL — ABNORMAL LOW (ref 4.22–5.81)
RDW: 12.9 % (ref 11.5–15.5)
WBC: 8.6 10*3/uL (ref 4.0–10.5)

## 2015-03-26 LAB — PROTIME-INR
INR: 1.02 (ref 0.00–1.49)
Prothrombin Time: 13.6 seconds (ref 11.6–15.2)

## 2015-03-26 LAB — URINALYSIS, ROUTINE W REFLEX MICROSCOPIC
Bilirubin Urine: NEGATIVE
Glucose, UA: NEGATIVE mg/dL
Hgb urine dipstick: NEGATIVE
Ketones, ur: NEGATIVE mg/dL
Leukocytes, UA: NEGATIVE
Nitrite: NEGATIVE
Protein, ur: NEGATIVE mg/dL
Specific Gravity, Urine: 1.018 (ref 1.005–1.030)
Urobilinogen, UA: 0.2 mg/dL (ref 0.0–1.0)
pH: 6.5 (ref 5.0–8.0)

## 2015-03-26 LAB — APTT: aPTT: 29 seconds (ref 24–37)

## 2015-03-26 LAB — MRSA PCR SCREENING: MRSA by PCR: NEGATIVE

## 2015-03-26 LAB — ABO/RH: ABO/RH(D): O POS

## 2015-03-26 MED ORDER — CEFUROXIME SODIUM 1.5 G IJ SOLR
1.5000 g | INTRAMUSCULAR | Status: AC
  Administered 2015-03-27: 1.5 g via INTRAVENOUS
  Filled 2015-03-26 (×2): qty 1.5

## 2015-03-26 MED ORDER — NICOTINE 14 MG/24HR TD PT24
14.0000 mg | MEDICATED_PATCH | Freq: Every day | TRANSDERMAL | Status: DC
  Administered 2015-03-26 – 2015-03-31 (×6): 14 mg via TRANSDERMAL
  Filled 2015-03-26 (×6): qty 1

## 2015-03-26 MED ORDER — IOHEXOL 300 MG/ML  SOLN
75.0000 mL | Freq: Once | INTRAMUSCULAR | Status: AC | PRN
  Administered 2015-03-26: 100 mL via INTRAVENOUS

## 2015-03-26 NOTE — Progress Notes (Addendum)
      [address]       [name] [address]             [phone]         Procedure: s/p Left chest tube  Subjective: Patient with pain at chest tube site.  Objective: Vital signs in last 24 hours: Temp:  [97.8 F (36.6 C)-98.5 F (36.9 C)] 98 F (36.7 C) (06/16 0456) Pulse Rate:  [64-68] 64 (06/16 0456) Cardiac Rhythm:  [-] Normal sinus rhythm (06/15 2000) Resp:  [17-18] 17 (06/16 0456) BP: (112-132)/(65-83) 132/83 mmHg (06/16 0456) SpO2:  [99 %-100 %] 99 % (06/16 0456)     Intake/Output from previous day: 06/15 0701 - 06/16 0700 In: 1320 [P.O.:1320] Out: 20 [Chest Tube:20]   Physical Exam:  Cardiovascular: RRR Pulmonary: Clear to auscultation bilaterally; no rales, wheezes, or rhonchi. Abdomen: Soft, non tender, bowel sounds present. Extremities: No lower extremity edema. Wounds: Dressing is clean and dry.   Chest Tube: to suction and an air leak present  Lab Results: CBC: Recent Labs  03/24/15 0720 03/25/15 0447  WBC 7.0 7.9  HGB 13.9 12.5*  HCT 39.9 36.4*  PLT 228 207   BMET:  Recent Labs  03/24/15 0720 03/25/15 0447  NA 139 136  K 4.0 4.3  CL 108 106  CO2 21* 23  GLUCOSE 128* 112*  BUN 16 13  CREATININE 1.06 0.93  CALCIUM 9.4 8.7*    PT/INR: No results for input(s): LABPROT, INR in the last 72 hours. ABG:  INR: Will add last result for INR, ABG once components are confirmed Will add last 4 CBG results once components are confirmed  Assessment/Plan:  1. CV - SR in the 60's. 2.  Pulmonary - On room air. Left chest tube is to suction  . There is a +2 air leak. CXR appears to show no left pneumtohorax,  left subcutaneous emphysema left lateral chest wall. Will discuss management of chest tube with Dr. Donata Clay.  ZIMMERMAN,DONIELLE MPA-C 03/26/2015,7:47 AM  Chest tube in good position, no PNTX on CXR However large airleak persists- will get CT chest today and plan L VATS and bleb stapling tomorrow Plan discussed  with patient who understands and agrees  patient examined and medical record reviewed,agree with above note. Kathlee Nations Trigt III 03/26/2015

## 2015-03-27 ENCOUNTER — Encounter (HOSPITAL_COMMUNITY)
Admission: EM | Disposition: A | Discharge status: Home / Self Care | Payer: Self-pay | Source: Home / Self Care | Attending: Cardiothoracic Surgery

## 2015-03-27 ENCOUNTER — Encounter (HOSPITAL_COMMUNITY): Payer: Self-pay | Admitting: Anesthesiology

## 2015-03-27 ENCOUNTER — Inpatient Hospital Stay (HOSPITAL_COMMUNITY): Payer: BLUE CROSS/BLUE SHIELD | Admitting: Certified Registered Nurse Anesthetist

## 2015-03-27 ENCOUNTER — Inpatient Hospital Stay (HOSPITAL_COMMUNITY): Payer: BLUE CROSS/BLUE SHIELD

## 2015-03-27 DIAGNOSIS — J9383 Other pneumothorax: Secondary | ICD-10-CM | POA: Diagnosis present

## 2015-03-27 HISTORY — PX: STAPLING OF BLEBS: SHX6429

## 2015-03-27 HISTORY — PX: VIDEO ASSISTED THORACOSCOPY: SHX5073

## 2015-03-27 LAB — GLUCOSE, CAPILLARY: Glucose-Capillary: 143 mg/dL — ABNORMAL HIGH (ref 65–99)

## 2015-03-27 LAB — BLOOD GAS, ARTERIAL
Acid-base deficit: 1.6 mmol/L (ref 0.0–2.0)
BICARBONATE: 24 meq/L (ref 20.0–24.0)
O2 CONTENT: 3 L/min
O2 Saturation: 98.5 %
Patient temperature: 97.5
TCO2: 25.6 mmol/L (ref 0–100)
pCO2 arterial: 49.8 mmHg — ABNORMAL HIGH (ref 35.0–45.0)
pH, Arterial: 7.301 — ABNORMAL LOW (ref 7.350–7.450)
pO2, Arterial: 130 mmHg — ABNORMAL HIGH (ref 80.0–100.0)

## 2015-03-27 SURGERY — VIDEO ASSISTED THORACOSCOPY
Anesthesia: General | Site: Chest | Laterality: Left

## 2015-03-27 MED ORDER — MIDAZOLAM HCL 2 MG/2ML IJ SOLN
INTRAMUSCULAR | Status: AC
  Filled 2015-03-27: qty 2

## 2015-03-27 MED ORDER — NALOXONE HCL 0.4 MG/ML IJ SOLN: 0.4000 mg | INTRAMUSCULAR | Status: DC | PRN

## 2015-03-27 MED ORDER — KETOROLAC TROMETHAMINE 30 MG/ML IJ SOLN: 30.0000 mg | Freq: Once | INTRAMUSCULAR | Status: DC | PRN

## 2015-03-27 MED ORDER — ACETAMINOPHEN 160 MG/5ML PO SOLN
1000.0000 mg | Freq: Four times a day (QID) | ORAL | Status: DC
  Filled 2015-03-27: qty 40

## 2015-03-27 MED ORDER — KCL IN DEXTROSE-NACL 20-5-0.9 MEQ/L-%-% IV SOLN
INTRAVENOUS | Status: DC
  Administered 2015-03-27: 19:00:00 via INTRAVENOUS
  Filled 2015-03-27 (×4): qty 1000

## 2015-03-27 MED ORDER — ROCURONIUM BROMIDE 100 MG/10ML IV SOLN
INTRAVENOUS | Status: DC | PRN
  Administered 2015-03-27: 30 mg via INTRAVENOUS
  Administered 2015-03-27: 40 mg via INTRAVENOUS

## 2015-03-27 MED ORDER — ACETAMINOPHEN 500 MG PO TABS
1000.0000 mg | ORAL_TABLET | Freq: Four times a day (QID) | ORAL | Status: DC
  Administered 2015-03-27 – 2015-03-31 (×11): 1000 mg via ORAL
  Filled 2015-03-27 (×18): qty 2

## 2015-03-27 MED ORDER — ROCURONIUM BROMIDE 50 MG/5ML IV SOLN
INTRAVENOUS | Status: AC
  Filled 2015-03-27: qty 1

## 2015-03-27 MED ORDER — KETOROLAC TROMETHAMINE 15 MG/ML IJ SOLN
15.0000 mg | Freq: Four times a day (QID) | INTRAMUSCULAR | Status: AC
  Administered 2015-03-27 – 2015-03-28 (×2): 15 mg via INTRAVENOUS
  Filled 2015-03-27 (×2): qty 1

## 2015-03-27 MED ORDER — LACTATED RINGERS IV SOLN
INTRAVENOUS | Status: DC | PRN
Start: 2015-03-27 — End: 2015-03-27
  Administered 2015-03-27 (×2): via INTRAVENOUS

## 2015-03-27 MED ORDER — DEXTROSE 5 % IV SOLN
1.5000 g | Freq: Once | INTRAVENOUS | Status: DC
  Filled 2015-03-27: qty 1.5

## 2015-03-27 MED ORDER — LIDOCAINE HCL (CARDIAC) 20 MG/ML IV SOLN
INTRAVENOUS | Status: DC | PRN
  Administered 2015-03-27: 100 mg via INTRAVENOUS

## 2015-03-27 MED ORDER — GLYCOPYRROLATE 0.2 MG/ML IJ SOLN
INTRAMUSCULAR | Status: DC | PRN
  Administered 2015-03-27: 0.4 mg via INTRAVENOUS
  Administered 2015-03-27 (×2): 0.1 mg via INTRAVENOUS

## 2015-03-27 MED ORDER — POTASSIUM CHLORIDE 10 MEQ/50ML IV SOLN
10.0000 meq | Freq: Every day | INTRAVENOUS | Status: DC | PRN
  Filled 2015-03-27: qty 50

## 2015-03-27 MED ORDER — BISACODYL 5 MG PO TBEC
10.0000 mg | DELAYED_RELEASE_TABLET | Freq: Every day | ORAL | Status: DC
  Administered 2015-03-28 – 2015-03-29 (×2): 10 mg via ORAL
  Filled 2015-03-27 (×2): qty 2

## 2015-03-27 MED ORDER — DEXAMETHASONE SODIUM PHOSPHATE 4 MG/ML IJ SOLN
INTRAMUSCULAR | Status: DC | PRN
  Administered 2015-03-27: 4 mg via INTRAVENOUS

## 2015-03-27 MED ORDER — 0.9 % SODIUM CHLORIDE (POUR BTL) OPTIME
TOPICAL | Status: DC | PRN
  Administered 2015-03-27 (×2): 1000 mL

## 2015-03-27 MED ORDER — DEXTROSE 5 % IV SOLN
1.5000 g | Freq: Two times a day (BID) | INTRAVENOUS | Status: AC
  Administered 2015-03-27 – 2015-03-28 (×2): 1.5 g via INTRAVENOUS
  Filled 2015-03-27 (×2): qty 1.5

## 2015-03-27 MED ORDER — PROMETHAZINE HCL 25 MG/ML IJ SOLN: 6.2500 mg | INTRAMUSCULAR | Status: DC | PRN

## 2015-03-27 MED ORDER — FENTANYL CITRATE (PF) 250 MCG/5ML IJ SOLN
INTRAMUSCULAR | Status: AC
  Filled 2015-03-27: qty 5

## 2015-03-27 MED ORDER — HYDROMORPHONE HCL 1 MG/ML IJ SOLN
INTRAMUSCULAR | Status: AC
  Filled 2015-03-27: qty 1

## 2015-03-27 MED ORDER — ONDANSETRON HCL 4 MG/2ML IJ SOLN
INTRAMUSCULAR | Status: DC | PRN
  Administered 2015-03-27: 4 mg via INTRAVENOUS

## 2015-03-27 MED ORDER — SODIUM CHLORIDE 0.9 % IJ SOLN
9.0000 mL | INTRAMUSCULAR | Status: DC | PRN
  Administered 2015-03-28: 3 mL via INTRAVENOUS
  Filled 2015-03-27: qty 9

## 2015-03-27 MED ORDER — OXYCODONE HCL 5 MG PO TABS: 5.0000 mg | ORAL_TABLET | Freq: Once | ORAL | Status: DC | PRN

## 2015-03-27 MED ORDER — LORATADINE 10 MG PO TABS: 10.0000 mg | ORAL_TABLET | Freq: Every day | ORAL | Status: DC

## 2015-03-27 MED ORDER — LIDOCAINE HCL (CARDIAC) 20 MG/ML IV SOLN
INTRAVENOUS | Status: AC
  Filled 2015-03-27: qty 5

## 2015-03-27 MED ORDER — ONDANSETRON HCL 4 MG/2ML IJ SOLN: 4.0000 mg | Freq: Four times a day (QID) | INTRAMUSCULAR | Status: DC | PRN

## 2015-03-27 MED ORDER — HEMOSTATIC AGENTS (NO CHARGE) OPTIME
TOPICAL | Status: DC | PRN
  Administered 2015-03-27: 1 via TOPICAL

## 2015-03-27 MED ORDER — KETAMINE HCL 10 MG/ML IJ SOLN
INTRAMUSCULAR | Status: DC | PRN
  Administered 2015-03-27: 30 mg via INTRAVENOUS

## 2015-03-27 MED ORDER — TRAMADOL HCL 50 MG PO TABS
50.0000 mg | ORAL_TABLET | Freq: Four times a day (QID) | ORAL | Status: DC | PRN
  Administered 2015-03-30 (×3): 100 mg via ORAL
  Filled 2015-03-27 (×3): qty 2

## 2015-03-27 MED ORDER — KETAMINE HCL 100 MG/ML IJ SOLN
INTRAMUSCULAR | Status: AC
  Filled 2015-03-27: qty 1

## 2015-03-27 MED ORDER — LACTATED RINGERS IV SOLN
INTRAVENOUS | Status: DC | PRN
  Administered 2015-03-27: 14:00:00 via INTRAVENOUS

## 2015-03-27 MED ORDER — KETOROLAC TROMETHAMINE 30 MG/ML IJ SOLN
INTRAMUSCULAR | Status: DC | PRN
  Administered 2015-03-27: 30 mg via INTRAVENOUS

## 2015-03-27 MED ORDER — PROPOFOL 10 MG/ML IV BOLUS
INTRAVENOUS | Status: DC | PRN
  Administered 2015-03-27: 50 mg via INTRAVENOUS
  Administered 2015-03-27: 150 mg via INTRAVENOUS

## 2015-03-27 MED ORDER — INSULIN ASPART 100 UNIT/ML ~~LOC~~ SOLN
0.0000 [IU] | Freq: Four times a day (QID) | SUBCUTANEOUS | Status: DC
  Administered 2015-03-27 – 2015-03-28 (×2): 2 [IU] via SUBCUTANEOUS

## 2015-03-27 MED ORDER — GLYCOPYRROLATE 0.2 MG/ML IJ SOLN
INTRAMUSCULAR | Status: AC
  Filled 2015-03-27: qty 3

## 2015-03-27 MED ORDER — PROPOFOL 10 MG/ML IV BOLUS
INTRAVENOUS | Status: AC
  Filled 2015-03-27: qty 20

## 2015-03-27 MED ORDER — FENTANYL CITRATE (PF) 100 MCG/2ML IJ SOLN
INTRAMUSCULAR | Status: DC | PRN
  Administered 2015-03-27: 50 ug via INTRAVENOUS
  Administered 2015-03-27 (×2): 100 ug via INTRAVENOUS
  Administered 2015-03-27: 50 ug via INTRAVENOUS
  Administered 2015-03-27: 100 ug via INTRAVENOUS
  Administered 2015-03-27 (×3): 50 ug via INTRAVENOUS

## 2015-03-27 MED ORDER — OXYCODONE HCL 5 MG/5ML PO SOLN: 5.0000 mg | Freq: Once | ORAL | Status: DC | PRN

## 2015-03-27 MED ORDER — KETOROLAC TROMETHAMINE 15 MG/ML IJ SOLN: 15.0000 mg | Freq: Four times a day (QID) | INTRAMUSCULAR | Status: DC

## 2015-03-27 MED ORDER — NEOSTIGMINE METHYLSULFATE 10 MG/10ML IV SOLN
INTRAVENOUS | Status: DC | PRN
  Administered 2015-03-27: 3 mg via INTRAVENOUS

## 2015-03-27 MED ORDER — ONDANSETRON HCL 4 MG/2ML IJ SOLN
INTRAMUSCULAR | Status: AC
  Filled 2015-03-27: qty 2

## 2015-03-27 MED ORDER — DIPHENHYDRAMINE HCL 50 MG/ML IJ SOLN: 12.5000 mg | Freq: Four times a day (QID) | INTRAMUSCULAR | Status: DC | PRN

## 2015-03-27 MED ORDER — GLYCOPYRROLATE 0.2 MG/ML IJ SOLN
INTRAMUSCULAR | Status: AC
  Filled 2015-03-27: qty 1

## 2015-03-27 MED ORDER — SODIUM CHLORIDE 0.9 % IJ SOLN
INTRAMUSCULAR | Status: AC
  Filled 2015-03-27: qty 10

## 2015-03-27 MED ORDER — HYDROMORPHONE HCL 1 MG/ML IJ SOLN
0.2500 mg | INTRAMUSCULAR | Status: DC | PRN
  Administered 2015-03-27 (×4): 0.5 mg via INTRAVENOUS

## 2015-03-27 MED ORDER — OXYCODONE HCL 5 MG PO TABS
5.0000 mg | ORAL_TABLET | ORAL | Status: DC | PRN
  Administered 2015-03-29: 10 mg via ORAL
  Filled 2015-03-27: qty 2

## 2015-03-27 MED ORDER — MIDAZOLAM HCL 5 MG/5ML IJ SOLN
INTRAMUSCULAR | Status: DC | PRN
  Administered 2015-03-27: 2 mg via INTRAVENOUS

## 2015-03-27 MED ORDER — FENTANYL 10 MCG/ML IV SOLN
INTRAVENOUS | Status: DC
  Administered 2015-03-27: 135 ug via INTRAVENOUS
  Administered 2015-03-27: 15 ug via INTRAVENOUS
  Administered 2015-03-28: 120 ug via INTRAVENOUS
  Administered 2015-03-28: 19:00:00 via INTRAVENOUS
  Administered 2015-03-28: 165 ug via INTRAVENOUS
  Administered 2015-03-28: 164.1 ug via INTRAVENOUS
  Administered 2015-03-28: 165 ug via INTRAVENOUS
  Administered 2015-03-28: 06:00:00 via INTRAVENOUS
  Administered 2015-03-28: 90 ug via INTRAVENOUS
  Administered 2015-03-28: 135 ug via INTRAVENOUS
  Administered 2015-03-28: 75 ug via INTRAVENOUS
  Administered 2015-03-29: 120 ug via INTRAVENOUS
  Administered 2015-03-29 (×2): 80 ug via INTRAVENOUS
  Administered 2015-03-29: 09:00:00 via INTRAVENOUS
  Administered 2015-03-29: 0 ug via INTRAVENOUS
  Administered 2015-03-30: 30 ug via INTRAVENOUS
  Administered 2015-03-30: 59 ug via INTRAVENOUS
  Administered 2015-03-30: 120 ug via INTRAVENOUS
  Filled 2015-03-27 (×5): qty 50

## 2015-03-27 MED ORDER — DIPHENHYDRAMINE HCL 12.5 MG/5ML PO ELIX
12.5000 mg | ORAL_SOLUTION | Freq: Four times a day (QID) | ORAL | Status: DC | PRN
  Filled 2015-03-27: qty 5

## 2015-03-27 MED ORDER — SENNOSIDES-DOCUSATE SODIUM 8.6-50 MG PO TABS
1.0000 | ORAL_TABLET | Freq: Every day | ORAL | Status: DC
Start: 2015-03-27 — End: 2015-03-31
  Administered 2015-03-27 – 2015-03-28 (×2): 1 via ORAL
  Filled 2015-03-27 (×5): qty 1

## 2015-03-27 SURGICAL SUPPLY — 72 items
ADH SKN CLS APL DERMABOND .7 (GAUZE/BANDAGES/DRESSINGS)
APL SKNCLS STERI-STRIP NONHPOA (GAUZE/BANDAGES/DRESSINGS) ×2
APL SRG 22X2 LUM MLBL SLNT (VASCULAR PRODUCTS) ×2
APPLICATOR TIP EXT COSEAL (VASCULAR PRODUCTS) ×2 IMPLANT
BAG DECANTER FOR FLEXI CONT (MISCELLANEOUS) IMPLANT
BENZOIN TINCTURE PRP APPL 2/3 (GAUZE/BANDAGES/DRESSINGS) ×2 IMPLANT
BLADE SURG 11 STRL SS (BLADE) ×4 IMPLANT
CANISTER SUCTION 2500CC (MISCELLANEOUS) ×4 IMPLANT
CATH KIT ON Q 5IN SLV (PAIN MANAGEMENT) IMPLANT
CATH ROBINSON RED A/P 22FR (CATHETERS) IMPLANT
CATH THORACIC 28FR (CATHETERS) ×2 IMPLANT
CATH THORACIC 36FR (CATHETERS) IMPLANT
CATH THORACIC 36FR RT ANG (CATHETERS) IMPLANT
CLEANER TIP ELECTROSURG 2X2 (MISCELLANEOUS) ×2 IMPLANT
CLOSURE WOUND 1/2 X4 (GAUZE/BANDAGES/DRESSINGS) ×1
CONT SPEC 4OZ CLIKSEAL STRL BL (MISCELLANEOUS) ×8 IMPLANT
COVER SURGICAL LIGHT HANDLE (MISCELLANEOUS) ×8 IMPLANT
CUTTER ECHEON FLEX ENDO 45 340 (ENDOMECHANICALS) ×2 IMPLANT
DERMABOND ADVANCED (GAUZE/BANDAGES/DRESSINGS)
DERMABOND ADVANCED .7 DNX12 (GAUZE/BANDAGES/DRESSINGS) IMPLANT
DRAPE LAPAROSCOPIC ABDOMINAL (DRAPES) ×4 IMPLANT
DRAPE WARM FLUID 44X44 (DRAPE) ×4 IMPLANT
ELECT REM PT RETURN 9FT ADLT (ELECTROSURGICAL) ×4
ELECTRODE REM PT RTRN 9FT ADLT (ELECTROSURGICAL) ×2 IMPLANT
GAUZE SPONGE 4X4 12PLY STRL (GAUZE/BANDAGES/DRESSINGS) ×4 IMPLANT
GLOVE BIO SURGEON STRL SZ7.5 (GLOVE) ×8 IMPLANT
GOWN STRL REUS W/ TWL LRG LVL3 (GOWN DISPOSABLE) ×6 IMPLANT
GOWN STRL REUS W/TWL LRG LVL3 (GOWN DISPOSABLE) ×12
KIT BASIN OR (CUSTOM PROCEDURE TRAY) ×4 IMPLANT
KIT ROOM TURNOVER OR (KITS) ×4 IMPLANT
KIT SUCTION CATH 14FR (SUCTIONS) ×4 IMPLANT
NS IRRIG 1000ML POUR BTL (IV SOLUTION) ×8 IMPLANT
PACK CHEST (CUSTOM PROCEDURE TRAY) ×4 IMPLANT
PAD ARMBOARD 7.5X6 YLW CONV (MISCELLANEOUS) ×8 IMPLANT
RELOAD GOLD ECHELON 45 (STAPLE) ×8 IMPLANT
SEALANT SURG COSEAL 4ML (VASCULAR PRODUCTS) IMPLANT
SEALANT SURG COSEAL 8ML (VASCULAR PRODUCTS) ×2 IMPLANT
SOLUTION ANTI FOG 6CC (MISCELLANEOUS) ×4 IMPLANT
SPONGE GAUZE 4X4 12PLY STER LF (GAUZE/BANDAGES/DRESSINGS) ×2 IMPLANT
SPONGE TONSIL 1 RF SGL (DISPOSABLE) ×4 IMPLANT
STRIP CLOSURE SKIN 1/2X4 (GAUZE/BANDAGES/DRESSINGS) ×1 IMPLANT
SUT CHROMIC 3 0 SH 27 (SUTURE) IMPLANT
SUT ETHILON 3 0 PS 1 (SUTURE) IMPLANT
SUT PROLENE 3 0 SH DA (SUTURE) IMPLANT
SUT PROLENE 4 0 RB 1 (SUTURE)
SUT PROLENE 4-0 RB1 .5 CRCL 36 (SUTURE) IMPLANT
SUT SILK  1 MH (SUTURE) ×4
SUT SILK 1 MH (SUTURE) ×4 IMPLANT
SUT SILK 1 TIES 10X30 (SUTURE) ×2 IMPLANT
SUT SILK 2 0SH CR/8 30 (SUTURE) ×2 IMPLANT
SUT SILK 3 0SH CR/8 30 (SUTURE) IMPLANT
SUT VIC AB 1 CTX 18 (SUTURE) IMPLANT
SUT VIC AB 2 TP1 27 (SUTURE) IMPLANT
SUT VIC AB 2-0 CT1 27 (SUTURE) ×4
SUT VIC AB 2-0 CT1 TAPERPNT 27 (SUTURE) IMPLANT
SUT VIC AB 2-0 CT2 18 VCP726D (SUTURE) IMPLANT
SUT VIC AB 2-0 CTX 36 (SUTURE) IMPLANT
SUT VIC AB 3-0 SH 18 (SUTURE) ×4 IMPLANT
SUT VIC AB 3-0 X1 27 (SUTURE) ×4 IMPLANT
SUT VICRYL 0 UR6 27IN ABS (SUTURE) ×6 IMPLANT
SUT VICRYL 2 TP 1 (SUTURE) IMPLANT
SWAB COLLECTION DEVICE MRSA (MISCELLANEOUS) IMPLANT
SYSTEM SAHARA CHEST DRAIN ATS (WOUND CARE) ×4 IMPLANT
TAPE CLOTH SURG 4X10 WHT LF (GAUZE/BANDAGES/DRESSINGS) ×2 IMPLANT
TIP APPLICATOR SPRAY EXTEND 16 (VASCULAR PRODUCTS) IMPLANT
TOWEL OR 17X24 6PK STRL BLUE (TOWEL DISPOSABLE) ×4 IMPLANT
TOWEL OR 17X26 10 PK STRL BLUE (TOWEL DISPOSABLE) ×8 IMPLANT
TRAP SPECIMEN MUCOUS 40CC (MISCELLANEOUS) IMPLANT
TRAY FOLEY CATH 16FRSI W/METER (SET/KITS/TRAYS/PACK) ×4 IMPLANT
TROCAR FLEXIPATH THORACIC 15MM (ENDOMECHANICALS) ×2 IMPLANT
TUBE ANAEROBIC SPECIMEN COL (MISCELLANEOUS) IMPLANT
WATER STERILE IRR 1000ML POUR (IV SOLUTION) ×8 IMPLANT

## 2015-03-27 NOTE — Progress Notes (Signed)
Procedure(s) (LRB): VIDEO ASSISTED THORACOSCOPY (Left) STAPLING OF BLEBS (Left) Subjective: CXR shows expanded L lung but air leak remains significant CT scan images reviewed and show blebs at Left apexstable Objective: Vital signs in last 24 hours: Temp:  [98.2 F (36.8 C)-98.8 F (37.1 C)] 98.2 F (36.8 C) (06/17 0433) Pulse Rate:  [59-78] 73 (06/17 0433) Cardiac Rhythm:  [-] Normal sinus rhythm (06/17 0726) Resp:  [18] 18 (06/16 2033) BP: (117-134)/(68-85) 134/85 mmHg (06/17 0433) SpO2:  [98 %-100 %] 98 % (06/17 0904)  Hemodynamic parameters for last 24 hours:    Intake/Output from previous day: 06/16 0701 - 06/17 0700 In: 720 [P.O.:720] Out: -  Intake/Output this shift:    Alert, comfortable He understands the reason for VATS to treat spontaneous PNTX and the benefits- risks involved  Lab Results:  Recent Labs  03/25/15 0447 03/26/15 1445  WBC 7.9 8.6  HGB 12.5* 13.2  HCT 36.4* 38.3*  PLT 207 212   BMET:  Recent Labs  03/25/15 0447 03/26/15 1445  NA 136 138  K 4.3 3.9  CL 106 106  CO2 23 25  GLUCOSE 112* 116*  BUN 13 13  CREATININE 0.93 0.93  CALCIUM 8.7* 9.1    PT/INR:  Recent Labs  03/26/15 1445  LABPROT 13.6  INR 1.02   ABG    Component Value Date/Time   PHART 7.404 03/26/2015 1455   HCO3 23.9 03/26/2015 1455   TCO2 25.1 03/26/2015 1455   ACIDBASEDEF 0.2 03/26/2015 1455   O2SAT 97.8 03/26/2015 1455   CBG (last 3)  No results for input(s): GLUCAP in the last 72 hours.  Assessment/Plan: S/P Procedure(s) (LRB): VIDEO ASSISTED THORACOSCOPY (Left) STAPLING OF BLEBS (Left) VATS later today to staple blebs, pleurodesis   LOS: 3 days    Kathlee Nations Trigt III 03/27/2015

## 2015-03-27 NOTE — Brief Op Note (Signed)
03/24/2015 - 03/27/2015  4:16 PM  PATIENT:  Hunter Newman  34 y.o. male  PRE-OPERATIVE DIAGNOSIS:  1. Spontaneous Left pneumothorax 2. Persistent air leak 3. History of tobacco abuse  POST-OPERATIVE DIAGNOSIS:  1. Spontaneous Left pneumothorax 2. Persistent air leak 3. History of tobacco abuse  PROCEDURE:  LEFT VIDEO ASSISTED THORACOSCOPY (Left), STAPLING OF BLEBS, MECHANICAL PLEURODESIS  SURGEON:  Surgeon(s) and Role:    * Kerin Perna, MD - Primary  PHYSICIAN ASSISTANT: Doree Fudge PA-C  ANESTHESIA:   general  EBL:  Total I/O In: 1400 [I.V.:1400] Out: 25 [Blood:25]  BLOOD ADMINISTERED:none  DRAINS: 28 Chest tube placed in the left pleural space   SPECIMEN:  Source of Specimen:  Apex LUL (with blebs)  DISPOSITION OF SPECIMEN:  PATHOLOGY  COUNTS CORRECT:  YES  DICTATION: .Dragon Dictation  PLAN OF CARE: Admit to inpatient   PATIENT DISPOSITION:  PACU - hemodynamically stable.   Delay start of Pharmacological VTE agent (>24hrs) due to surgical blood loss or risk of bleeding: yes

## 2015-03-27 NOTE — Care Management Note (Signed)
Case Management Note  Patient Details  Name: Hunter Newman MRN: [id number] Date of Birth: [birth year]  Subjective/Objective:        Pt admitted with spont. pntx- plan for VATS today 03/27/15            Action/Plan: PTA pt lived at home- - independent- anticipate return home when medically stable  Expected Discharge Date:                  Expected Discharge Plan:  Home/Self Care  In-House Referral:     Discharge planning Services  CM Consult  Post Acute Care Choice:    Choice offered to:     DME Arranged:    DME Agency:     HH Arranged:    HH Agency:     Status of Service:  In process, will continue to follow  Medicare Important Message Given:    Date Medicare IM Given:    Medicare IM give by:    Date Additional Medicare IM Given:    Additional Medicare Important Message give by:     If discussed at Long Length of Stay Meetings, dates discussed:    Additional Comments:  Darrold Span, RN 03/27/2015, 10:54 AM

## 2015-03-27 NOTE — Anesthesia Preprocedure Evaluation (Addendum)
Anesthesia Evaluation  Patient identified by MRN, date of birth, ID band Patient awake    Reviewed: Allergy & Precautions, NPO status , Patient's Chart, lab work & pertinent test results  Airway Mallampati: I  TM Distance: >3 FB Neck ROM: Full    Dental   Pulmonary Current Smoker,  Left pneumothorax breath sounds clear to auscultation        Cardiovascular negative cardio ROS  Rhythm:Regular Rate:Normal     Neuro/Psych negative neurological ROS     GI/Hepatic negative GI ROS, Neg liver ROS,   Endo/Other  negative endocrine ROS  Renal/GU negative Renal ROS     Musculoskeletal   Abdominal   Peds  Hematology negative hematology ROS (+)   Anesthesia Other Findings   Reproductive/Obstetrics                            Anesthesia Physical Anesthesia Plan  ASA: III  Anesthesia Plan: General   Post-op Pain Management:    Induction: Intravenous  Airway Management Planned: Double Lumen EBT  Additional Equipment: Arterial line  Intra-op Plan:   Post-operative Plan: Extubation in OR  Informed Consent: I have reviewed the patients History and Physical, chart, labs and discussed the procedure including the risks, benefits and alternatives for the proposed anesthesia with the patient or authorized representative who has indicated his/her understanding and acceptance.   Dental advisory given  Plan Discussed with: CRNA  Anesthesia Plan Comments:         Anesthesia Quick Evaluation

## 2015-03-27 NOTE — Anesthesia Postprocedure Evaluation (Signed)
  Anesthesia Post-op Note  Patient: Hunter Newman  Procedure(s) Performed: Procedure(s): VIDEO ASSISTED THORACOSCOPY (Left) STAPLING OF BLEBS (Left)  Patient Location: PACU  Anesthesia Type: General   Level of Consciousness: awake, alert  and oriented  Airway and Oxygen Therapy: Patient Spontanous Breathing  Post-op Pain: moderate  Post-op Assessment: Post-op Vital signs reviewed  Post-op Vital Signs: Reviewed  Last Vitals:  Filed Vitals:   03/27/15 1724  BP: 158/82  Pulse: 76  Temp:   Resp: 17    Complications: No apparent anesthesia complications

## 2015-03-27 NOTE — Anesthesia Procedure Notes (Addendum)
Procedure Name: Intubation Date/Time: 03/27/2015 2:32 PM Performed by: Leonel Ramsay Pre-anesthesia Checklist: Patient identified, Timeout performed, Emergency Drugs available, Suction available and Patient being monitored Patient Re-evaluated:Patient Re-evaluated prior to inductionOxygen Delivery Method: Circle system utilized Preoxygenation: Pre-oxygenation with 100% oxygen Intubation Type: IV induction Ventilation: Mask ventilation without difficulty Laryngoscope Size: Mac and 4 Grade View: Grade I Tube type: Oral Endobronchial tube: Left and EBT position confirmed by fiberoptic bronchoscope and 37 Fr Number of attempts: 2 Airway Equipment and Method: Stylet Placement Confirmation: ETT inserted through vocal cords under direct vision,  positive ETCO2 and breath sounds checked- equal and bilateral Tube secured with: Tape Dental Injury: Teeth and Oropharynx as per pre-operative assessment  Comments: Unable to place 39Fr EBT, 37Fr placed

## 2015-03-27 NOTE — Transfer of Care (Signed)
Immediate Anesthesia Transfer of Care Note  Patient: Hunter Newman  Procedure(s) Performed: Procedure(s): VIDEO ASSISTED THORACOSCOPY (Left) STAPLING OF BLEBS (Left)  Patient Location: PACU  Anesthesia Type:General  Level of Consciousness: awake, alert  and oriented  Airway & Oxygen Therapy: Patient Spontanous Breathing and Patient connected to nasal cannula oxygen  Post-op Assessment: Report given to RN and Post -op Vital signs reviewed and stable  Post vital signs: Reviewed and stable  Last Vitals:  Filed Vitals:   03/27/15 1300  BP: 117/74  Pulse: 66  Temp: 36.9 C  Resp:     Complications: No apparent anesthesia complications

## 2015-03-28 ENCOUNTER — Inpatient Hospital Stay (HOSPITAL_COMMUNITY): Payer: BLUE CROSS/BLUE SHIELD

## 2015-03-28 LAB — POCT I-STAT 3, ART BLOOD GAS (G3+)
Acid-Base Excess: 1 mmol/L (ref 0.0–2.0)
Bicarbonate: 25.9 mEq/L — ABNORMAL HIGH (ref 20.0–24.0)
O2 Saturation: 99 %
TCO2: 27 mmol/L (ref 0–100)
pCO2 arterial: 41.4 mmHg (ref 35.0–45.0)
pH, Arterial: 7.403 (ref 7.350–7.450)
pO2, Arterial: 136 mmHg — ABNORMAL HIGH (ref 80.0–100.0)

## 2015-03-28 LAB — CBC
HCT: 36.6 % — ABNORMAL LOW (ref 39.0–52.0)
Hemoglobin: 12.8 g/dL — ABNORMAL LOW (ref 13.0–17.0)
MCH: 33.9 pg (ref 26.0–34.0)
MCHC: 35 g/dL (ref 30.0–36.0)
MCV: 96.8 fL (ref 78.0–100.0)
PLATELETS: 251 10*3/uL (ref 150–400)
RBC: 3.78 MIL/uL — ABNORMAL LOW (ref 4.22–5.81)
RDW: 12.7 % (ref 11.5–15.5)
WBC: 12.8 10*3/uL — AB (ref 4.0–10.5)

## 2015-03-28 LAB — BASIC METABOLIC PANEL
ANION GAP: 8 (ref 5–15)
BUN: 8 mg/dL (ref 6–20)
CO2: 24 mmol/L (ref 22–32)
CREATININE: 0.76 mg/dL (ref 0.61–1.24)
Calcium: 9 mg/dL (ref 8.9–10.3)
Chloride: 103 mmol/L (ref 101–111)
GFR calc Af Amer: >60 mL/min (ref >60)
GLUCOSE: 143 mg/dL — AB (ref 65–99)
Potassium: 4.2 mmol/L (ref 3.5–5.1)
Sodium: 135 mmol/L (ref 135–145)

## 2015-03-28 LAB — GLUCOSE, CAPILLARY: Glucose-Capillary: 134 mg/dL — ABNORMAL HIGH (ref 65–99)

## 2015-03-28 NOTE — Plan of Care (Signed)
Problem: Phase I Progression Outcomes Goal: Activity tolerated as ordered Outcome: Completed/Met Date Met:  03/28/15 Ambulated 600 feet x 2. Up to chair x 3.

## 2015-03-28 NOTE — Op Note (Signed)
NAME:  Hunter Newman, Hunter Newman NO.:  [id number]  MEDICAL RECORD NO.:  [id number]  LOCATION:  2S01C                        FACILITY:  [facility]  PHYSICIAN:  Kerin Perna, M.D.  DATE OF BIRTH:  1980/12/11  DATE OF PROCEDURE:  03/27/2015 DATE OF DISCHARGE:                              OPERATIVE REPORT   OPERATION:  Left VATS (video-assisted thoracoscopic surgery) with wedge resection of left upper lobe blebs and pleurodesis.  PREOPERATIVE DIAGNOSIS:  Spontaneous pneumothorax with persistent air leak.  POSTOPERATIVE DIAGNOSIS:  Spontaneous pneumothorax with persistent air leak.  SURGEON:  Kerin Perna, M.D.  ANESTHESIA:  General by Dr. Marcene Duos.  INDICATIONS:  The patient is a 34 year old African American male smoker, thin body habitus, who presents with a first-time spontaneous left pneumothorax of approximately 80%.  A chest tube was placed in the emergency department.  The patient was admitted.  Over the next 48 hours, the lung showed reexpansion on chest x-ray.  However, there was a persistent large air leak through the chest tube.  CT scan of the chest showed apical blebs, left greater than right side.  Left VATS with resection of apical blebs and pleurodesis was recommended.  I discussed the procedure in detail with the patient including the indications, benefits, alternatives, and risks.  He understood where the surgical incisions would be and the expected postoperative recovery.  He understood that there were risks of recurrent pneumothorax, infection, bleeding.  He agreed to proceed under what I felt was an informed consent.  OPERATIVE PROCEDURE:  The patient was brought to the operating room and placed supine on the operating table.  General anesthesia was induced. A double-lumen endotracheal tube was placed by the anesthesiologist. The patient was rolled with the left side up.  The previously placed left chest tube was removed.  The left chest  was prepped and draped as a sterile field.  A proper time-out was performed.  Three incisions were made around the left thorax, one below the tip of the scapula, one in the fifth interspace anterior axillary line, and one in the fourth interspace.  The camera was inserted.  The lung was nicely collapse.  There were blebs present in the apex.  The entire lung was carefully visualized with the camera and no other bleb disease or abnormalities were noted.  Using the endoscopic stapling devices and the endoscopic instruments, the apex of the left lung was excised with a wedge resection.  The staple line was reinforced with biologic adhesive- Coseal.  No other blebs were identified.  The upper half of the left chest parietal pleura was then abraded with a cautery sponge to create a mechanical pleurodesis.  A 28 chest tube was placed through a small incision and directed to the apex of the lung.  The lung was then re-expanded under direct vision and filled the left chest base well.  Incisions were closed in layers using Vicryl.  The chest tube was connected to underwater seal Pleur-Evac. There was no significant air leak through the chest tube.  The patient was then turned supine and extubated, and returned to the recovery room in stable condition.     Hunter Newman,  M.D.     PV/MEDQ  D:  03/27/2015  T:  03/28/2015  Job:  [id number]

## 2015-03-28 NOTE — Progress Notes (Signed)
1 Day Post-Op Procedure(s) (LRB): VIDEO ASSISTED THORACOSCOPY (Left) STAPLING OF BLEBS (Left) Subjective: Doing well after left VATS, resection of blebs and pleurodesis for spontaneous pneumothorax  Objective: Vital signs in last 24 hours: Temp:  [97.5 F (36.4 C)-99 F (37.2 C)] 98.1 F (36.7 C) (06/18 0900) Pulse Rate:  [56-111] 63 (06/18 0900) Cardiac Rhythm:  [-] Normal sinus rhythm;Other (Comment) (06/18 0800) Resp:  [8-29] 19 (06/18 0900) BP: (105-166)/(55-93) 108/62 mmHg (06/18 0900) SpO2:  [97 %-100 %] 99 % (06/18 0900) Arterial Line BP: (62-199)/(44-99) 71/46 mmHg (06/18 0500)  Hemodynamic parameters for last 24 hours:   stable  Intake/Output from previous day: 06/17 0701 - 06/18 0700 In: 2550 [I.V.:2500; IV Piggyback:50] Out: 1675 [Urine:1500; Blood:25; Chest Tube:150] Intake/Output this shift: Total I/O In: 606.4 [P.O.:410; I.V.:196.4] Out: 415 [Urine:400; Chest Tube:15]  Exam No air leak from chest tube Heart rate regular Minimal bleeding  Lab Results:  Recent Labs  03/26/15 1445 03/28/15 0405  WBC 8.6 12.8*  HGB 13.2 12.8*  HCT 38.3* 36.6*  PLT 212 251   BMET:  Recent Labs  03/26/15 1445 03/28/15 0405  NA 138 135  K 3.9 4.2  CL 106 103  CO2 25 24  GLUCOSE 116* 143*  BUN 13 8  CREATININE 0.93 0.76  CALCIUM 9.1 9.0    PT/INR:  Recent Labs  03/26/15 1445  LABPROT 13.6  INR 1.02   ABG    Component Value Date/Time   PHART 7.403 03/28/2015 0414   HCO3 25.9* 03/28/2015 0414   TCO2 27 03/28/2015 0414   ACIDBASEDEF 1.6 03/27/2015 1648   O2SAT 99.0 03/28/2015 0414   CBG (last 3)   Recent Labs  03/27/15 2340 03/28/15 0544  GLUCAP 143* 134*    Assessment/Plan: S/P Procedure(s) (LRB): VIDEO ASSISTED THORACOSCOPY (Left) STAPLING OF BLEBS (Left) Mobilize d/c tubes/lines Plan on transfer to telemetry bed, 2 W.  LOS: 4 days    Kathlee Nations Trigt III 03/28/2015

## 2015-03-29 ENCOUNTER — Inpatient Hospital Stay (HOSPITAL_COMMUNITY): Payer: BLUE CROSS/BLUE SHIELD

## 2015-03-29 LAB — COMPREHENSIVE METABOLIC PANEL
ALBUMIN: 3.7 g/dL (ref 3.5–5.0)
ALT: 26 U/L (ref 17–63)
ANION GAP: 6 (ref 5–15)
AST: 41 U/L (ref 15–41)
Alkaline Phosphatase: 57 U/L (ref 38–126)
BILIRUBIN TOTAL: 0.4 mg/dL (ref 0.3–1.2)
BUN: 14 mg/dL (ref 6–20)
CHLORIDE: 104 mmol/L (ref 101–111)
CO2: 26 mmol/L (ref 22–32)
Calcium: 9.2 mg/dL (ref 8.9–10.3)
Creatinine, Ser: 0.74 mg/dL (ref 0.61–1.24)
GFR calc Af Amer: >60 mL/min (ref >60)
GFR calc non Af Amer: >60 mL/min (ref >60)
Glucose, Bld: 109 mg/dL — ABNORMAL HIGH (ref 65–99)
Potassium: 4.2 mmol/L (ref 3.5–5.1)
Sodium: 136 mmol/L (ref 135–145)
TOTAL PROTEIN: 7 g/dL (ref 6.5–8.1)

## 2015-03-29 LAB — CBC
HCT: 38.5 % — ABNORMAL LOW (ref 39.0–52.0)
Hemoglobin: 13.2 g/dL (ref 13.0–17.0)
MCH: 33.7 pg (ref 26.0–34.0)
MCHC: 34.3 g/dL (ref 30.0–36.0)
MCV: 98.2 fL (ref 78.0–100.0)
PLATELETS: 261 10*3/uL (ref 150–400)
RBC: 3.92 MIL/uL — AB (ref 4.22–5.81)
RDW: 12.9 % (ref 11.5–15.5)
WBC: 12.9 10*3/uL — ABNORMAL HIGH (ref 4.0–10.5)

## 2015-03-29 MED ORDER — KETOROLAC TROMETHAMINE 30 MG/ML IJ SOLN
30.0000 mg | Freq: Four times a day (QID) | INTRAMUSCULAR | Status: AC
  Administered 2015-03-29 (×2): 30 mg via INTRAVENOUS
  Filled 2015-03-29: qty 1

## 2015-03-29 MED ORDER — KETOROLAC TROMETHAMINE 60 MG/2ML IM SOLN
30.0000 mg | Freq: Four times a day (QID) | INTRAMUSCULAR | Status: DC
  Filled 2015-03-29 (×2): qty 2

## 2015-03-29 NOTE — Progress Notes (Signed)
Utilization review completed.  

## 2015-03-29 NOTE — Progress Notes (Addendum)
      [address]       [name] [address]             [phone]       2 Days Post-Op Procedure(s) (LRB): VIDEO ASSISTED THORACOSCOPY (Left) STAPLING OF BLEBS (Left)  Subjective: Patient with pain at chest tube. IV may have infiltrated so it was changed.  Objective: Vital signs in last 24 hours: Temp:  [97.8 F (36.6 C)-99 F (37.2 C)] 98.1 F (36.7 C) (06/19 0559) Pulse Rate:  [63-102] 79 (06/19 0559) Cardiac Rhythm:  [-] Normal sinus rhythm (06/18 2000) Resp:  [12-21] 16 (06/19 0559) BP: (107-138)/(54-82) 134/82 mmHg (06/19 0559) SpO2:  [96 %-100 %] 100 % (06/19 0559) FiO2 (%):  [40 %] 40 % (06/18 1850)     Intake/Output from previous day: 06/18 0701 - 06/19 0700 In: 1154.9 [P.O.:870; I.V.:284.9] Out: 1255 [Urine:1225; Chest Tube:30]   Physical Exam:  Cardiovascular: RRR. Pulmonary: Clear to auscultation bilaterally; no rales, wheezes, or rhonchi. Abdomen: Soft, non tender, bowel sounds present. Extremities: No lower extremity edema. Wounds: Dressing is clean and dry.   Chest Tube: to suction and no air leak  Lab Results: CBC: Recent Labs  03/28/15 0405 03/29/15 0425  WBC 12.8* 12.9*  HGB 12.8* 13.2  HCT 36.6* 38.5*  PLT 251 261   BMET:  Recent Labs  03/28/15 0405 03/29/15 0425  NA 135 136  K 4.2 4.2  CL 103 104  CO2 24 26  GLUCOSE 143* 109*  BUN 8 14  CREATININE 0.76 0.74  CALCIUM 9.0 9.2    PT/INR:  Recent Labs  03/26/15 1445  LABPROT 13.6  INR 1.02   ABG:  INR: Will add last result for INR, ABG once components are confirmed Will add last 4 CBG results once components are confirmed  Assessment/Plan:  1. CV - SR in the 70's. 2.  Pulmonary - Chest tubes with 30 cc of output last 24 hours. Chest tube is to suction. There is no air leak. Will  place to water seal.CXR not ordered for today-will order.Encourage incentive spirometer. 3. H and H stable at 13.2 and 38.5 4. Pain slightly better controlled now that  IV site changed. Will give 2 doses of Toradol today.  ZIMMERMAN,DONIELLE MPA-C 03/29/2015,8:55 AM Plan to DC CT in am patient examined and medical record reviewed,agree with above note. Kathlee Nations Trigt III 03/29/2015

## 2015-03-30 ENCOUNTER — Encounter (HOSPITAL_COMMUNITY): Payer: Self-pay | Admitting: Cardiothoracic Surgery

## 2015-03-30 ENCOUNTER — Inpatient Hospital Stay (HOSPITAL_COMMUNITY): Payer: BLUE CROSS/BLUE SHIELD

## 2015-03-30 LAB — GLUCOSE, CAPILLARY: Glucose-Capillary: 138 mg/dL — ABNORMAL HIGH (ref 65–99)

## 2015-03-30 NOTE — Progress Notes (Addendum)
      [address]       [place] [address]             [phone]      3 Days Post-Op Procedure(s) (LRB): VIDEO ASSISTED THORACOSCOPY (Left) STAPLING OF BLEBS (Left)   Subjective:  [name] has no new complaints.  States he is doing okay.  Minor pain at chest tube site  Objective: Vital signs in last 24 hours: Temp:  [97.5 F (36.4 C)-98.5 F (36.9 C)] 98.5 F (36.9 C) (06/20 0502) Pulse Rate:  [77-89] 89 (06/20 0502) Cardiac Rhythm:  [-] Normal sinus rhythm (06/20 0010) Resp:  [11-22] 14 (06/20 0502) BP: (125-141)/(78-89) 141/89 mmHg (06/20 0502) SpO2:  [96 %-100 %] 100 % (06/20 0502) FiO2 (%):  [28 %] 28 % (06/20 0346) 06/19 0701 - 06/20 0700 In: 600 [P.O.:600] Out: 1500 [Urine:1500] t: Total I/O In: -  Out: 450 [Urine:400; Chest Tube:50]  General appearance: alert, cooperative and no distress Heart: regular rate and rhythm Lungs: clear to auscultation bilaterally Abdomen: soft, non-tender; bowel sounds normal; no masses,  no organomegaly Wound: clean and dry  Lab Results:  Recent Labs  03/28/15 0405 03/29/15 0425  WBC 12.8* 12.9*  HGB 12.8* 13.2  HCT 36.6* 38.5*  PLT 251 261   BMET:  Recent Labs  03/28/15 0405 03/29/15 0425  NA 135 136  K 4.2 4.2  CL 103 104  CO2 24 26  GLUCOSE 143* 109*  BUN 8 14  CREATININE 0.76 0.74  CALCIUM 9.0 9.2    PT/INR: No results for input(s): LABPROT, INR in the last 72 hours. ABG    Component Value Date/Time   PHART 7.403 03/28/2015 0414   HCO3 25.9* 03/28/2015 0414   TCO2 27 03/28/2015 0414   ACIDBASEDEF 1.6 03/27/2015 1648   O2SAT 99.0 03/28/2015 0414   CBG (last 3)   Recent Labs  03/27/15 2340 03/28/15 0544  GLUCAP 143* 134*    Assessment/Plan: S/P Procedure(s) (LRB): VIDEO ASSISTED THORACOSCOPY (Left) STAPLING OF BLEBS (Left)  1. Chest tube- no air leak, minimal chest tube output- will d/c today 2. Pulm- no acute issues, off oxygen, continue IS- CXR remains stable, no  pneumothorax, sub Q air remains stable 3. Pain- will d/c PCA after chest tube removal, utilize oral medications 4. Dispo- patient stable, d/c chest tube today, likely home in AM   LOS: 6 days    Hunter Newman 03/30/2015   DC chest tube              Path of lung with bullae, fibrosis      patient examined and medical record reviewed,agree with above note. Kathlee Nations Trigt III 03/30/2015

## 2015-03-30 NOTE — Progress Notes (Signed)
Removed left lateral chest tube.  Applied petroleum gauze, 4x4 gauze and hypofix tape.  Patient tolerated procedure well without any issues. Pt instructed to call if any shortness of breath or bleeding from site. Pt resting with call bell within reach.  Will continue to monitor. Thomas Hoff, RN

## 2015-03-30 NOTE — Discharge Summary (Signed)
Physician Discharge Summary       [address]       [name] [address]             [phone]    Patient ID: Hunter Newman MRN: [id number] DOB/AGE: 05/19/1981 34 y.o.  Admit date: 03/24/2015 Discharge date: 03/31/2015  Admission Diagnoses:  Discharge Diagnoses:  Active Problems:   Pneumothorax on left   Spontaneous pneumothorax   Procedure (s): 1. Placement of left chest tube on 03/25/2015 by Dr. Donata Clay. 2.Left VATS (video-assisted thoracoscopic surgery) with wedge resection of left upper lobe blebs and pleurodesis by Dr. Donata Clay on 03/28/2015  History of Presenting Illness: This is a 34 year old African American male with asthenic body habitus presents with first time episode of a left spontaneous pneumothorax. He denies any recent trauma, violent coughing, or strenuous activity. He is an active smoker-1/2 PPD. He works for [employer]. He denies fever , wt loss, hemoptysis  Dr. Donata Clay placed a left 20 F chest tube placed in ED with immediate exit of air from the left chest. There was a small air leak thru pleurovac post tube placement. He was admitted.  Brief Hospital Course:  He had a persistent air leak. A CT of the chest was done on 06/16. Results showed a left-sided chest tube with a small left pneumothorax measuring 12 mm in thickness (less than 10%). Soft tissue emphysema in the left anterolateral chest wall which appears to be centered at the chest tube insertion which may reflect an air leak.  Multiple biapical blebs.  Dr. Donata Clay discussed the need for a left VATS, wedge of left upper lobe blebs and mechanical pleurodesis. Potential risks, benefits, and complications of the surgery were discussed with the patient and he agreed to proceed. He underwent the aforementioned surgery on 03/28/2015.  A line and foley were removed early in his post operative Chest x rays has remained stable. course. His chest tube had no air leak and had minimal output.   It was put to water seal on 06/19. Chest tube was removed on 06/20. He is ambulating on room air. He is tolerating a diet. His wounds are clean and dry and continuing to heal. He is felt surgically stable for discharge today.  Latest Vital Signs: Blood pressure 143/88, pulse 71, temperature 98.5 F (36.9 C), temperature source Oral, resp. rate 18, height 5\' 7"  (1.702 m), weight 124 lb 12.5 oz (56.6 kg), SpO2 100 %.   Discharge Condition:Stable and discharged to home.  Recent laboratory studies:  Lab Results  Component Value Date   WBC 12.9* 03/29/2015   HGB 13.2 03/29/2015   HCT 38.5* 03/29/2015   MCV 98.2 03/29/2015   PLT 261 03/29/2015   Lab Results  Component Value Date   NA 136 03/29/2015   K 4.2 03/29/2015   CL 104 03/29/2015   CO2 26 03/29/2015   CREATININE 0.74 03/29/2015   GLUCOSE 109* 03/29/2015      Diagnostic Studies: Ct Chest W Contrast  03/26/2015   CLINICAL DATA:  Left-sided pneumothorax. Shortness of breath and chest pain  EXAM: CT CHEST WITH CONTRAST  TECHNIQUE: Multidetector CT imaging of the chest was performed during intravenous contrast administration.  CONTRAST:  OMNIPAQUE IOHEXOL 300 MG/ML  SOLN  COMPARISON:  None.  FINDINGS: The central airways are patent. There is a left-sided chest tube with a small left pneumothorax measuring 12 mm in thickness (less than 10%). There are bilateral apical blebs. There  is no pleural effusion or pneumothorax. There is soft tissue emphysema in the left anterolateral chest wall which appears to be centered at the chest tube insertion concerning for an air leak.  There are no pathologically enlarged axillary, hilar or mediastinal lymph nodes.  The heart size is normal. There is no pericardial effusion. The thoracic aorta is normal in caliber.  Review of bone windows demonstrates no focal lytic or sclerotic lesions.  Limited non-contrast images of the upper abdomen were obtained. The adrenal glands appear normal. The remainder  of the visualized abdominal organs are unremarkable.  IMPRESSION: 1. Left-sided chest tube with a small left pneumothorax measuring 12 mm in thickness (less than 10%). Soft tissue emphysema in the left anterolateral chest wall which appears to be centered at the chest tube insertion which may reflect an air leak. 2. Multiple biapical blebs.   Electronically Signed   By: Elige Ko   On: 03/26/2015 19:01   Dg Chest Port 1 View  03/30/2015   CLINICAL DATA:  Spontaneous pneumothorax.  EXAM: PORTABLE CHEST - 1 VIEW  COMPARISON:  03/29/2015  FINDINGS: There is no residual pneumothorax. Surgical staples at the apex. Left chest tube in good position. Lungs are clear. Heart size and vascularity are normal.  IMPRESSION: No residual pneumothorax.   Electronically Signed   By: Francene Boyers M.D.   On: 03/30/2015 08:00   Discharge Medications:    Medication List    STOP taking these medications        metroNIDAZOLE 500 MG tablet  Commonly known as:  FLAGYL      TAKE these medications        cetirizine 10 MG tablet  Commonly known as:  ZYRTEC  Take 1 tablet (10 mg total) by mouth daily.     erythromycin ophthalmic ointment  Place a 1/2 inch ribbon of ointment into the lower eyelid.     oxyCODONE 5 MG immediate release tablet  Commonly known as:  Oxy IR/ROXICODONE  Take 1-2 tablets (5-10 mg total) by mouth every 4 (four) hours as needed for severe pain.     traMADol 50 MG tablet  Commonly known as:  ULTRAM  Take 1 tablet (50 mg total) by mouth every 6 (six) hours as needed for pain.        Follow Up Appointments: Follow-up Information    Follow up with Kerin Perna III, MD On 04/15/2015.   Specialty:  Cardiothoracic Surgery   Why:  PA/LAT CXR to be taken (at [facility] which is in the same building as Dr. Zenaida Niece Trigt's office) on 04/15/2015 at 8:45 am;Appointment time is at  9:30 am   Contact information:   [address] [place] [address] [phone]        Signed: Marrion Coy 03/31/2015, 7:44 AM

## 2015-03-30 NOTE — Discharge Instructions (Signed)
ACTIVITY:  1.Increase activity slowly. 2.Walk daily and increase frequency and duration as tolerates. 3.May walk up steps. 4.No lifting more than ten pounds for two weeks. 5.No driving for two weeks. 6.Avoid straining. 7.STOP any activity that causes chest pain, shortness of breath, dizziness,sweating,     or excessive weakness. 8.Continue with breathing exercises daily. 9. STOP smoking  DIET:  Regular diet   WOUND:  1.May shower. 2.Clean wounds with mild soap and water.  Call the office at [phone] if any problems arise.   Video-Assisted Thoracic Surgery Video-assisted thoracic surgery (VATS) is a procedure that allows your caregiver to look inside your chest and do some minor operations. VATS is commonly done to:  Study or diagnose problems in the chest.  Take a tissue sample (biopsy).   Put medications directly into the chest.   Remove collections of fluid, pus, or blood.  Remove tumors. VATS is done using thoracoscopy. Thoracoscopy is a procedure in which a thin, lighted tube (thoracoscope) is put through a small surgical cut (incision)inyour chest wall. The thoracoscope used for VATS has a video camera on it. It allows your caregiver to see the inside of your chest on a television screen. LET YOUR CAREGIVER KNOW ABOUT:   Any allergies you have.  All medications you are taking, including vitamins, herbs, eyedrops, and over-the-counter medications and creams.  Use of steroids (by mouth or creams).   Previous problems you or members of your family have had with the use of anesthetics.  Any blood disorders you have had.  Any blood thinners, like warfarin, that you take.  Previous surgery.   Any history of heart problems.  Other health problems you RISKS AND COMPLICATIONS  Severe bleeding (hemorrhage).  Injury to nerves or other structures in the chest.   Lung infection.  The chest may need to be opened with a large incision (thoracotomy) if  the procedure cannot be completed safely with the thoracoscope. BEFORE THE PROCEDURE   Tests such as blood tests, urine tests, chest X-rays, and electrocardiography may be done.  Follow your caregiver's instructions if you are taking dietary supplements or medications. Your caregiver may tell you to stop taking them or to reduce your dosage.  Do not take new dietary supplements or medications within 1 week of your procedure unless your caregiver approves them.  Do not eat or drink within 8 hours of your procedure or as directed by your caregiver.  Do not smoke for as long as possible before your procedure. If possible, stop smoking 3-6 weeks before the procedure. PROCEDURE   You will be given a medication that makes you sleep (general anesthetic) through a mask, through an intravenous (IV) access tube, or through both. This medication will prevent you from being alert and feeling pain during the procedure.Once you are asleep, abreathing tube or mask may be used to help you breathe.  A video thoracoscope will be placed in a very small incision (less than an inch wide), so that your caregiver can see into your chest. The picture from inside the chest will be displayed on a television screen.  Other surgical tools will be inserted through the remaining incisions.  One of your lungs will be deflated as the surgery is performed. Deflating a lung makes it easier for your caregiver to see the area. The lung will be inflated at the end of the procedure.  The tools will be removed when your caregiver has finished performing the examination or procedure.  A chest tube will  be inserted through an incision to drain the wound. The remaining incisions will be closed with stitches or staples. AFTER THE PROCEDURE  Chest tubes are watched closely for signs of fluid or air buildup in the lungs. Your caregiver will most likely remove them prior to discharge.  You will need to stay in the hospital for 1-5  days. The average time in the hospital with VATS surgery is usually several days less than with standard open surgery.  You may receive medications to help with pain.  It is normal to feel sore for about 2 weeks after the procedure. Document Released: 01/21/2013 Document Reviewed: 01/21/2013 [facility] Patient Information 2015 [place]. This information is not intended to replace advice given to you by your health care provider. Make sure you discuss any questions you have with your health care provider.

## 2015-03-31 ENCOUNTER — Inpatient Hospital Stay (HOSPITAL_COMMUNITY): Payer: BLUE CROSS/BLUE SHIELD

## 2015-03-31 MED ORDER — OXYCODONE HCL 5 MG PO TABS: 5.0000 mg | ORAL_TABLET | ORAL | Status: DC | PRN

## 2015-03-31 NOTE — Progress Notes (Signed)
Reviewed discharge instructions with patient and family. Verbally understood instructions. IV removed with out issues. Awaiting his ride at this time.  Ilona Colley, Charlaine Dalton

## 2015-03-31 NOTE — Progress Notes (Signed)
      [address]       [place] [address]             [phone]      4 Days Post-Op Procedure(s) (LRB): VIDEO ASSISTED THORACOSCOPY (Left) STAPLING OF BLEBS (Left)   Subjective:  [name] has no complaints.  He is ready to go home.  Objective: Vital signs in last 24 hours: Temp:  [98.4 F (36.9 C)-98.9 F (37.2 C)] 98.5 F (36.9 C) (06/21 0510) Pulse Rate:  [71-87] 71 (06/21 0510) Cardiac Rhythm:  [-] Normal sinus rhythm (06/20 2200) Resp:  [14-18] 18 (06/21 0510) BP: (123-143)/(80-88) 143/88 mmHg (06/21 0510) SpO2:  [97 %-100 %] 100 % (06/21 0510) FiO2 (%):  [17 %] 17 % (06/20 0856)  Intake/Output from previous day: 06/20 0701 - 06/21 0700 In: 720 [P.O.:720] Out: 450 [Urine:400; Chest Tube:50]  General appearance: alert, cooperative and no distress Heart: regular rate and rhythm Lungs: clear to auscultation bilaterally Abdomen: soft, non-tender; bowel sounds normal; no masses,  no organomegaly Wound: clean and dry  Lab Results:  Recent Labs  03/29/15 0425  WBC 12.9*  HGB 13.2  HCT 38.5*  PLT 261   BMET:  Recent Labs  03/29/15 0425  NA 136  K 4.2  CL 104  CO2 26  GLUCOSE 109*  BUN 14  CREATININE 0.74  CALCIUM 9.2    PT/INR: No results for input(s): LABPROT, INR in the last 72 hours. ABG    Component Value Date/Time   PHART 7.403 03/28/2015 0414   HCO3 25.9* 03/28/2015 0414   TCO2 27 03/28/2015 0414   ACIDBASEDEF 1.6 03/27/2015 1648   O2SAT 99.0 03/28/2015 0414   CBG (last 3)  No results for input(s): GLUCAP in the last 72 hours.  Assessment/Plan: S/P Procedure(s) (LRB): VIDEO ASSISTED THORACOSCOPY (Left) STAPLING OF BLEBS (Left)  1. CV- hemodynamically stable 2. Pulm- off oxygen, no acute issues, CXR is free from pneumothorax post chest tube removal 3. Dispo- patient stable will d/c home today   LOS: 7 days    Raford Pitcher, Denny Peon 03/31/2015

## 2015-04-06 ENCOUNTER — Other Ambulatory Visit: Payer: Self-pay | Admitting: *Deleted

## 2015-04-06 DIAGNOSIS — G8918 Other acute postprocedural pain: Secondary | ICD-10-CM

## 2015-04-06 MED ORDER — OXYCODONE HCL 5 MG PO TABS: 5.0000 mg | ORAL_TABLET | ORAL | Status: DC | PRN

## 2015-04-06 NOTE — Telephone Encounter (Signed)
[name] has called for a refill for Oxycodone s/p VATS, STAPLING OF BLEBS and discharge on 03/31/15. I informed him that a new signed script would be available at our office today. He agreed.

## 2015-04-09 ENCOUNTER — Other Ambulatory Visit: Payer: Self-pay | Admitting: Cardiothoracic Surgery

## 2015-04-09 DIAGNOSIS — J9383 Other pneumothorax: Secondary | ICD-10-CM

## 2015-04-15 ENCOUNTER — Ambulatory Visit
Admission: RE | Admit: 2015-04-15 | Discharge: 2015-04-15 | Disposition: A | Discharge status: Home / Self Care | Payer: BLUE CROSS/BLUE SHIELD | Source: Ambulatory Visit | Attending: Cardiothoracic Surgery | Admitting: Cardiothoracic Surgery

## 2015-04-15 ENCOUNTER — Ambulatory Visit (INDEPENDENT_AMBULATORY_CARE_PROVIDER_SITE_OTHER): Payer: Self-pay | Admitting: Cardiothoracic Surgery

## 2015-04-15 ENCOUNTER — Encounter: Payer: Self-pay | Admitting: Cardiothoracic Surgery

## 2015-04-15 VITALS — BP 135/86 | HR 88 | Resp 16 | Ht 67.0 in | Wt 130.0 lb

## 2015-04-15 DIAGNOSIS — J449 Chronic obstructive pulmonary disease, unspecified: Secondary | ICD-10-CM

## 2015-04-15 DIAGNOSIS — J939 Pneumothorax, unspecified: Secondary | ICD-10-CM

## 2015-04-15 DIAGNOSIS — J9383 Other pneumothorax: Secondary | ICD-10-CM

## 2015-04-15 DIAGNOSIS — J439 Emphysema, unspecified: Secondary | ICD-10-CM

## 2015-04-15 NOTE — Progress Notes (Signed)
PCP is Jacquiline Doealeb Parker, MD Referring Provider is Pricilla LovelessGoldston, Scott, MD  Chief Complaint  Patient presents with  . Routine Post Op    s/p L VATS, STPLING OF BLEBS 03/27/15 with a cxr    [id number]:routine postop visit 3 weeks after left VATS and stapling of blebs for recurrent left pneumothorax Minimal postop pain. Chest x-ray clear. Patient is out of work for 2 more weeks to recover from surgery Surgical incisions healing well. Chest tube sutures removed.   Past Medical History  Diagnosis Date  . Tobacco abuse   . Marijuana abuse   . Chronic bronchitis   . Spontaneous pneumothorax 03/24/2015    left side    Past Surgical History  Procedure Laterality Date  . Eye surgery Left     lac repaired  . Eye surgery    . Dental surgery    . Chest tube insertion  03/24/2015  . Video assisted thoracoscopy Left 03/27/2015    Procedure: VIDEO ASSISTED THORACOSCOPY;  Surgeon: Kerin PernaPeter Van Trigt, MD;  [facility];  Service: Thoracic;  Laterality: Left;  . Stapling of blebs Left 03/27/2015    Procedure: STAPLING OF BLEBS;  Surgeon: Kerin PernaPeter Van Trigt, MD;  [facility];  Service: Thoracic;  Laterality: Left;    Family History  Problem Relation Age of Onset  . Diabetes Father   . Cancer Father     Social History History  Substance Use Topics  . Smoking status: Current Every Day Smoker -- 0.30 packs/day    Types: Cigarettes  . Smokeless tobacco: Never Used  . Alcohol Use: Yes     Comment: occasionally    Current Outpatient Prescriptions  Medication Sig Dispense Refill  . oxyCODONE (OXY IR/ROXICODONE) 5 MG immediate release tablet Take 1-2 tablets (5-10 mg total) by mouth every 4 (four) hours as needed for severe pain. 40 tablet 0   No current facility-administered medications for this visit.    No Known Allergies  Review of Systems   Improving strength No shortness of breath BP 135/86 mmHg  Pulse 88  Resp 16  Ht 5\' 7"  (1.702 m)  Wt 130 lb (58.968 kg)  BMI 20.36 kg/m2  SpO2  99% Physical Exam  Alert and comfortable Lungs clear Heart irregular VATS incision is well-healed  Diagnostic Tests: Chest x-ray personally reviewed showing no pneumothorax, no pleural effusion  Impression: Excellent recovery after left VATS for recurrent spontaneous pneumothorax. He is cleared to return to work in 2 weeks. July 18. Smoking cessation discussed with patient. Plan: Return if symptoms recurrent.  Mikey BussingPeter Van Trigt III, MD [facility] [phone]

## 2015-05-25 DIAGNOSIS — Z736 Limitation of activities due to disability: Secondary | ICD-10-CM

## 2015-11-20 ENCOUNTER — Encounter: Payer: Self-pay | Admitting: Family Medicine

## 2015-11-20 ENCOUNTER — Ambulatory Visit (INDEPENDENT_AMBULATORY_CARE_PROVIDER_SITE_OTHER): Payer: BLUE CROSS/BLUE SHIELD | Admitting: Family Medicine

## 2015-11-20 ENCOUNTER — Ambulatory Visit (HOSPITAL_COMMUNITY)
Admission: RE | Admit: 2015-11-20 | Discharge: 2015-11-20 | Disposition: A | Discharge status: Home / Self Care | Payer: BLUE CROSS/BLUE SHIELD | Source: Ambulatory Visit | Attending: Family Medicine | Admitting: Family Medicine

## 2015-11-20 ENCOUNTER — Other Ambulatory Visit (HOSPITAL_COMMUNITY)
Admission: RE | Admit: 2015-11-20 | Discharge: 2015-11-20 | Disposition: A | Discharge status: Home / Self Care | Payer: BLUE CROSS/BLUE SHIELD | Source: Ambulatory Visit | Attending: Family Medicine | Admitting: Family Medicine

## 2015-11-20 VITALS — BP 129/89 | HR 89 | Temp 98.4°F | Wt 128.0 lb

## 2015-11-20 DIAGNOSIS — Z113 Encounter for screening for infections with a predominantly sexual mode of transmission: Secondary | ICD-10-CM

## 2015-11-20 DIAGNOSIS — I4510 Unspecified right bundle-branch block: Secondary | ICD-10-CM | POA: Insufficient documentation

## 2015-11-20 DIAGNOSIS — R0789 Other chest pain: Secondary | ICD-10-CM

## 2015-11-20 DIAGNOSIS — Z23 Encounter for immunization: Secondary | ICD-10-CM

## 2015-11-20 DIAGNOSIS — R079 Chest pain, unspecified: Secondary | ICD-10-CM | POA: Diagnosis present

## 2015-11-20 NOTE — Progress Notes (Signed)
Patient ID: Hunter Newman, male   DOB: August 06, 1981, 35 y.o.   MRN: [id number]   Subjective:  This history was provided by the patient.  Hunter Newman is a 35 y.o. male who  has a past medical history of tobacco abuse; marijuana use; chronic bronchitis; and spontaneous pneumothorax (03/24/2015). Patient presents to clinic for substernal chest pain radiating to left chest x1 week.  Patient states the pain is sharp, intermittent and lasts for 1-2 hours when it occurs.  Pain is not associated with heavy lifting or particular movements.  He states it is worse when he takes a deep breath.  Patient estimates he has had 4-5 episodes of chest pain over past week.  Pain is not exertional and occurs at rest.  Patient denies associated symptoms such as N/V, dizziness, weakness, shortness of breath, cough, fever, trauma and numbness or tingling with onset of chest pain.  Patient has not treated pain with any medications, heat or ice.  Patient would also like to be checked for STDs and HIV.  He denies known exposure.  He denies penile discharge, lesions/masses on penis or scrotum and penile or scrotal swelling.      Review of Systems:  Per HPI. All other systems reviewed and are negative.   PMH, PSH, Medications, Allergies, and FmHx reviewed and updated in EMR.  Social History: current smoker  Objective:  BP 129/89 mmHg  Pulse 89  Temp(Src) 98.4 F (36.9 C) (Oral)  Wt 128 lb (58.06 kg)  SpO2 100%  General: 35 y.o. male Awake, alert, well-nourished, in no apparent distress and non-toxic in appearance Cardio: RRR, S1S2 heard, no murmurs appreciated, no lower extremity edema, rubor or pain with palpation.  No cyanosis or clubbing; +2 radial and dorsalis pedis pulses bilaterally.  No pain or crepitus with palpation to chest wall. Pulm: Clear to auscultation bilaterally in all lobes, no wheezes, rhonchi or rales. Trachea is midline, no increased WOB. GI: Soft, not tender, no distension Skin: dry, intact, no  rashes or lesions Neuro: Strength and sensation grossly intact  Assessment & Plan:  Hunter Newman is a 35 y.o. male here for intermittent substernal pain radiating to left chest x1 week.  Patient's physical exam unremarkable.  EKG not remarkable for ST segment elevation, non-specific T wave inversion in V1 and V2 that was not noted on previous EKG.  Patient denies exertional chest pain and chest pain is not associated with shortness of breath, weakness or dizziness.  Chest xray indicates no mediastinal widening and normal cardiac silhouette.  Patient smokes, but has no history of hyperlipidemia, hypertension or CAD.  Primary cardiac disease as cause of chest pain is unlikely.  Patient denies chest pain currently or shortness of breath.  He is not tachycardic, dyspneic or tachypneic.   Patient does not meet Wells criteria and is PERC negative.  Suspicion for PE is low.    Patient's lung sounds are clear in all fields and his chest xray indicated no pneumothorax or pleural effusions.  Patient ambulated to hospital and back for a CXR and his oxygen saturation and vitals remained stable on return.  Respiratory cause for chest pain is unlikely.  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      Encouraged patient to use OTC Tylenol and ibuprofen to treat pain.  Return precautions (chest pressure/tightness, SOB, dizziness) discussed with patient.     Nelly Rout, NP  [facility] 11/20/2015 10:39 AM   I have seen and evaluated the patient and reviewed the NP Student's note above. My separate physical exam, assessment and plan are outlined below:  Subjective:  Chest Pain Patient has a history of spontaneous pneumothorax last year. For the past week, patient has had intermittent, sharp, substernal chest pain that radiates to the left part of his chest. The pain will last for 4-5 hours then  subside. Patient has not tried any medications for this. Denies any recent trauma or injury. No heavy lifting or exercises No shortness of breath, though when the pain does occur, it is worse with deep inspiration. No history of blood clots. No feet or leg swelling. No prolonged immobilization. Pain can occur at rest. No reflux symptoms.   ROS: Per HPI   Objective:  Physical Exam: Blood pressure 129/89, pulse 89, temperature 98.4 F (36.9 C), temperature source Oral, weight 128 lb (58.06 kg), SpO2 100 %.  GEN: 35 year old man in NAD, sitting on exam table CV: RRR, no murmurs, rubs, or gallops PULM: NWOB, CTAB. Good air-movement throughout MSK: Chest wall nontender to palpation   Imaging/ Diagnostic Testing: Dg Chest 2 View  11/20/2015  CLINICAL DATA:  Chest pain for a week. History of pneumothorax June 2016. EXAM: CHEST - LEFT DECUBITUS; CHEST - 2 VIEW COMPARISON:  04/15/2015 FINDINGS: There is no focal parenchymal opacity. There is no pleural effusion or pneumothorax. The heart and mediastinal contours are unremarkable. The osseous structures are unremarkable. IMPRESSION: 1. No acute cardiopulmonary disease. Electronically Signed   By: Elige Ko   On: 11/20/2015 10:08   Dg Chest Left Decubitus  11/20/2015  CLINICAL DATA:  Chest pain for a week. History of pneumothorax June 2016. EXAM: CHEST - LEFT DECUBITUS; CHEST - 2 VIEW COMPARISON:  04/15/2015 FINDINGS: There is no focal parenchymal opacity. There is no pleural effusion or pneumothorax. The heart and mediastinal contours are unremarkable. The osseous structures are unremarkable. IMPRESSION: 1. No acute cardiopulmonary disease. Electronically Signed   By: Elige Ko   On: 11/20/2015 10:08   EKG: NSR, new twave inversion in V2, otherwise no significant changes since last EKG  Assessment/Plan:  Chest Pain Doubt cardiac etiology given patient's age and lack of risk factors. EKG unremarkable. PE not likely given negative PERC  negative. Plain films unremarkable without signs of pneumothorax or pneumonia. 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 Recommended NSAIDs as needed. Also may have GERD component, instructed patient to try antiacid medication if it recurs to see if that alleviates the pain. Return precautions reviewed. Instructed patient to return if symptoms not improving in 1 week. Also instructed patient to seek immediate emergency care if has significant chest pain or shortness of breath not responding to medications.   STD Screening Will check GC/CT, RPR, and HIV today per patient's request.  Katina Degree. Jimmey Ralph, MD [facility] Resident PGY-2 11/20/2015 12:18 PM

## 2015-11-20 NOTE — Patient Instructions (Addendum)
Your EKG was normal. We will check a chest xray to make sure that you do not have another pneumothorax. Your pain is probably not coming from your heart. Please go to the hospital to have this done.  Your other test results won't be back until next week.  If you continue to have chest pain that does not go away, please seek medical care immediately.  Take care,  Dr Jimmey Ralph

## 2015-11-21 LAB — RPR

## 2015-11-21 LAB — HIV ANTIBODY (ROUTINE TESTING W REFLEX): HIV 1&2 Ab, 4th Generation: NONREACTIVE

## 2015-11-23 LAB — URINE CYTOLOGY ANCILLARY ONLY
CHLAMYDIA, DNA PROBE: NEGATIVE
NEISSERIA GONORRHEA: NEGATIVE

## 2015-11-24 ENCOUNTER — Telehealth: Payer: Self-pay | Admitting: Family Medicine

## 2015-11-24 NOTE — Telephone Encounter (Signed)
Called patient to inform him of negative STD tests. Patient had no further questions.   Katina Degree. Jimmey Ralph, MD [facility] Resident PGY-2 11/24/2015 1:04 PM

## 2015-12-14 IMAGING — CR DG CHEST 2V
2 series · 2 of 2 positions shown · non-contrast
Comparison: [DATE] [DATE], [DATE]; [DATE] [DATE], [DATE]

CLINICAL DATA: Recent left-sided spontaneous pneumothorax.
Persistent left-sided chest discomfort

EXAM:
CHEST  2 VIEW

[w chest pa]
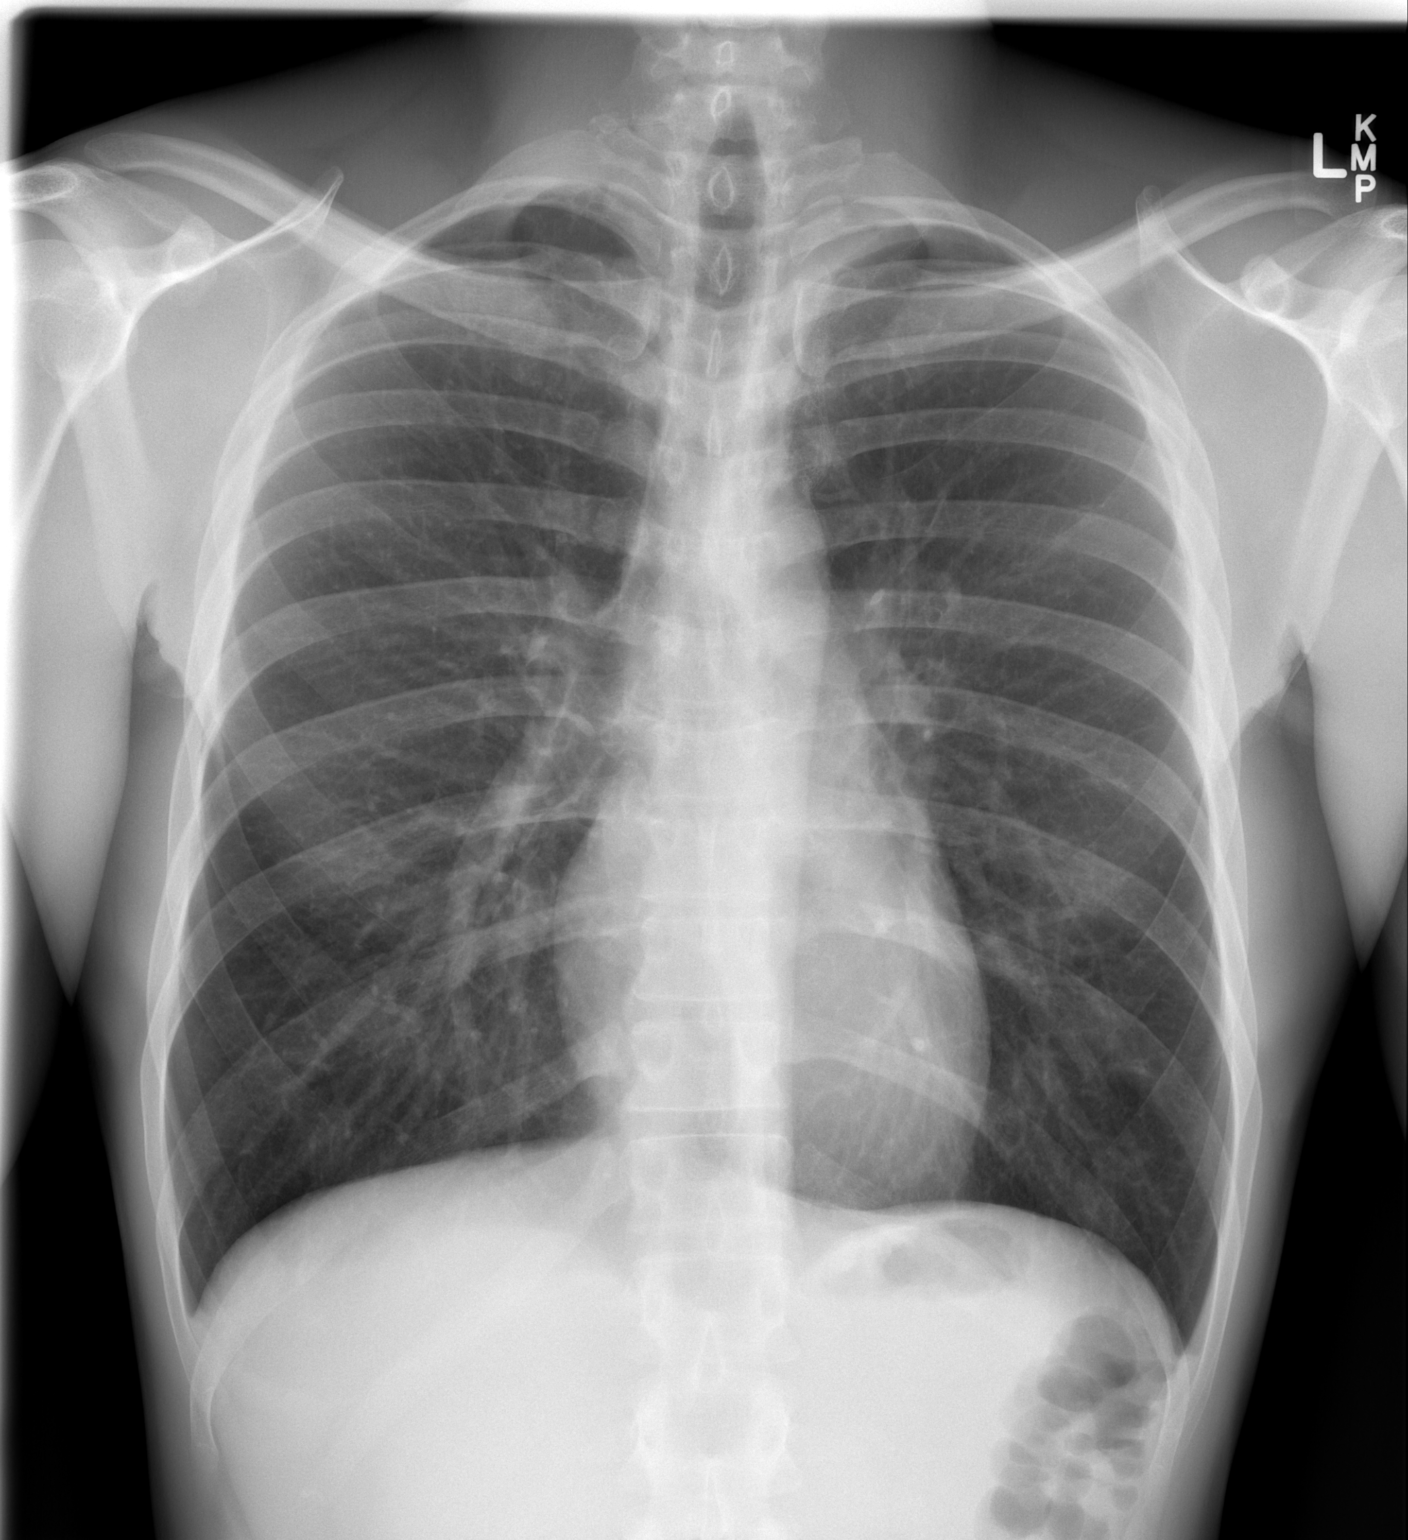

[w chest lat]
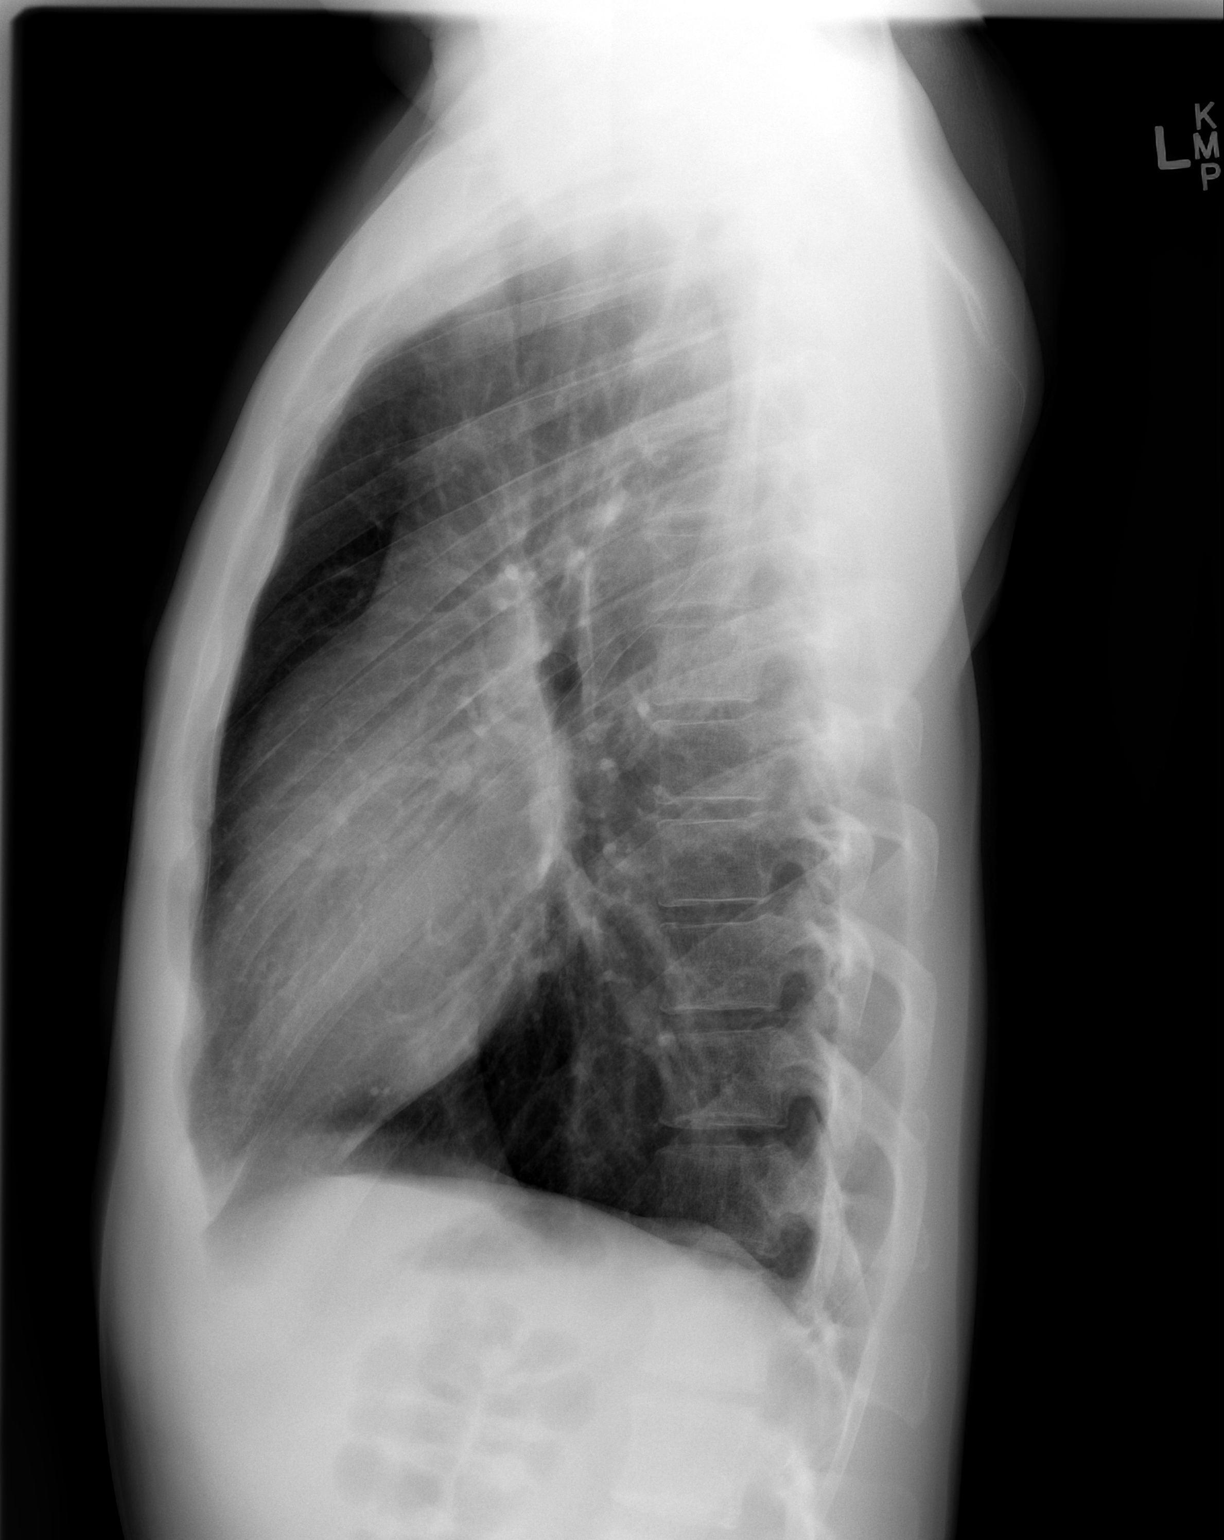

[2 of 2 positions shown; findings below may reference images not displayed]

FINDINGS: Postoperative changes noted in the left apex. There is no
demonstrable pneumothorax. Lungs are clear. Heart size and pulmonary
vascularity are normal. No adenopathy. No bone lesions.
IMPRESSION: Postoperative change left apex. No pneumothorax apparent. Lungs
clear.

## 2016-07-20 IMAGING — DX DG CHEST DECUBITUS*L*
2 series · 2 of 2 positions shown · non-contrast
Comparison: 04/15/2015

CLINICAL DATA: Chest pain for a week. History of pneumothorax March 2015.

EXAM:
CHEST - LEFT DECUBITUS; CHEST - 2 VIEW

[x chest decub (1 of 2)]
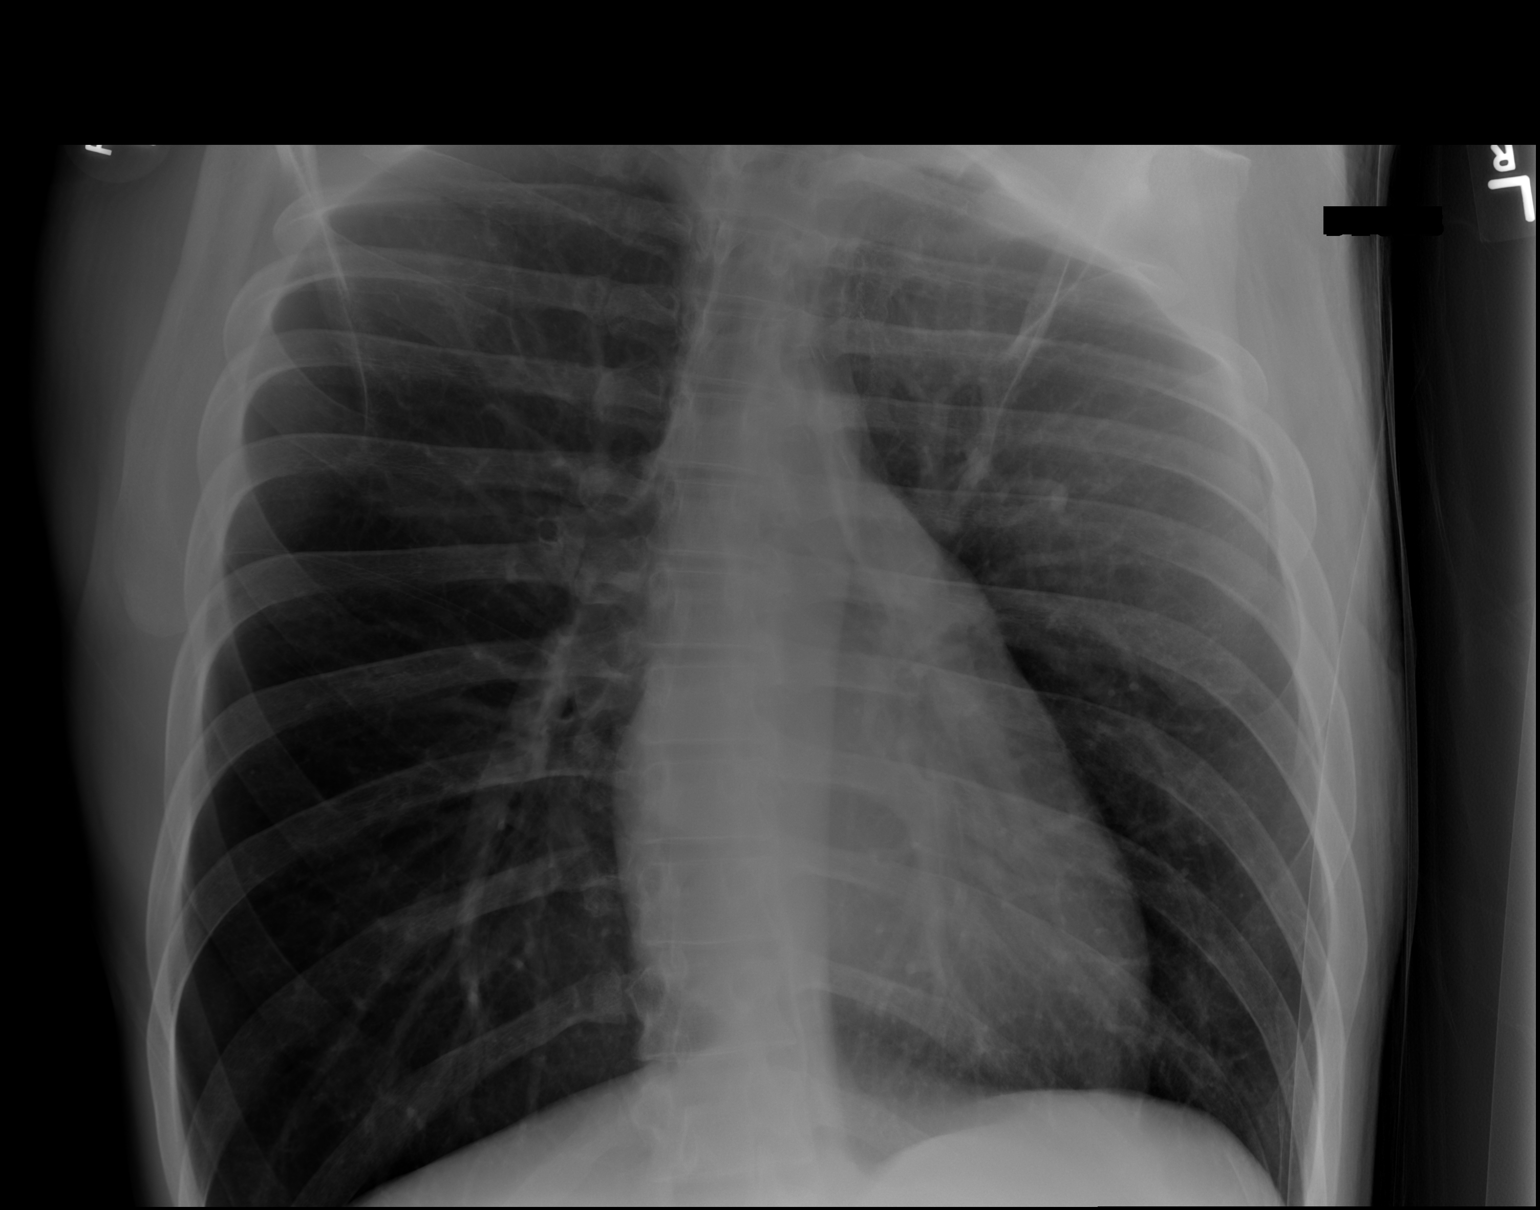

[x chest decub (2 of 2)]
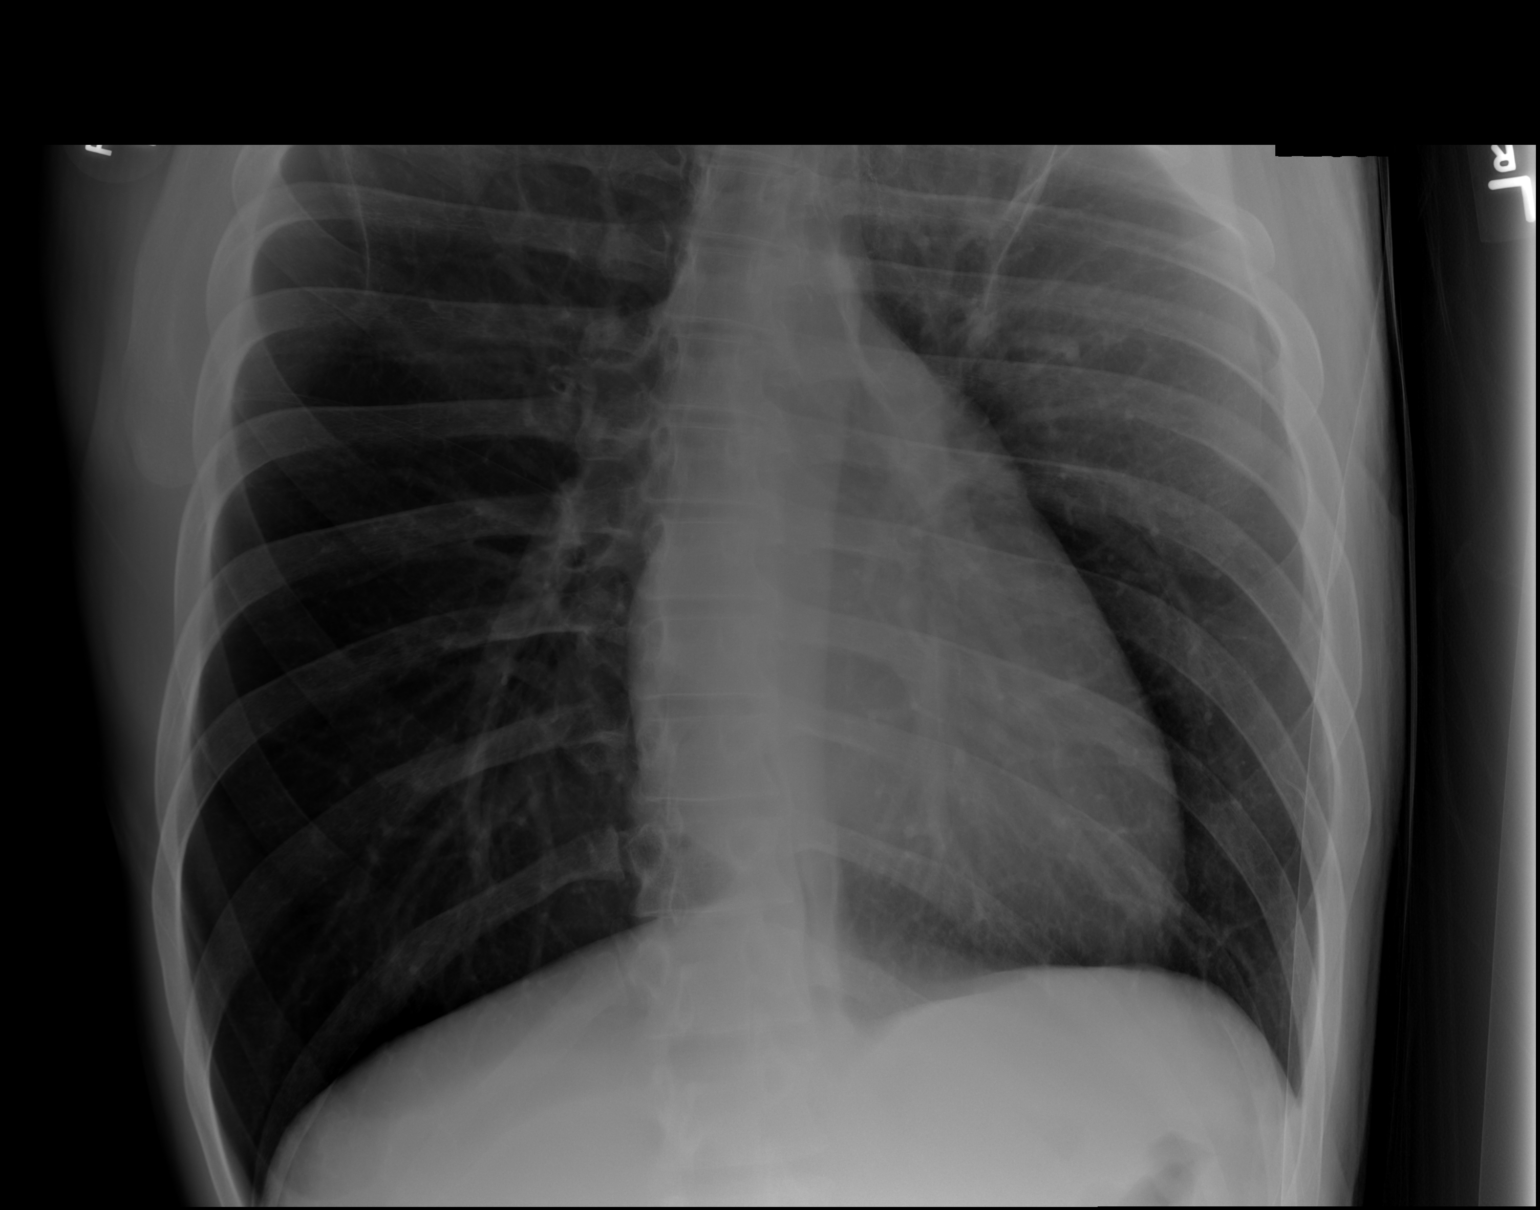

[2 of 2 positions shown; findings below may reference images not displayed]

FINDINGS: There is no focal parenchymal opacity. There is no pleural effusion
or pneumothorax. The heart and mediastinal contours are
unremarkable.

The osseous structures are unremarkable.
IMPRESSION: 1. No acute cardiopulmonary disease.

## 2017-05-16 ENCOUNTER — Ambulatory Visit: Payer: BLUE CROSS/BLUE SHIELD | Admitting: Family Medicine

## 2017-10-05 ENCOUNTER — Ambulatory Visit (INDEPENDENT_AMBULATORY_CARE_PROVIDER_SITE_OTHER): Payer: BLUE CROSS/BLUE SHIELD | Admitting: Family Medicine

## 2017-10-05 ENCOUNTER — Other Ambulatory Visit: Payer: Self-pay

## 2017-10-05 ENCOUNTER — Encounter: Payer: Self-pay | Admitting: Family Medicine

## 2017-10-05 DIAGNOSIS — Z23 Encounter for immunization: Secondary | ICD-10-CM

## 2017-10-05 NOTE — Patient Instructions (Signed)
Hamstring Strain A hamstring strain is an injury that occurs when the hamstring muscles are overstretched or overloaded. The hamstring muscles are a group of muscles at the back of the thighs. These muscles are used in straightening the hips, bending the knees, and pulling back the legs. This type of injury is often called a pulled hamstring muscle. The severity of a muscle strain is rated in degrees. First-degree strains have the least amount of muscle fiber tearing and pain. Second-degree and third-degree strains have increasingly more tearing and pain. What are the causes? Hamstring strains occur when a sudden, violent force is placed on these muscles and stretches them too far. This often occurs during activities that involve running, jumping, kicking, or weight lifting. What increases the risk? Hamstring strains are especially common in athletes. Other things that can increase your risk for this injury include:  Having low strength, endurance, or flexibility of the hamstring muscles.  Performing high-impact physical activity.  Having poor physical fitness.  Having a previous leg injury.  Having fatigued muscles.  Older age.  What are the signs or symptoms?  Pain in the back of the thigh.  Bruising.  Swelling.  Muscle spasm.  Difficulty using the muscle because of pain or lack of normal function. For severe strains, you may have a popping or snapping feeling when the injury occurs. How is this diagnosed? Your health care provider will perform a physical exam and ask about your medical history. How is this treated? Often, the best treatment for a hamstring strain is protecting, resting, icing, applying compression, and elevating the injured area. This is referred to as the PRICE method of treatment. Your health care provider may also recommend medicines to help reduce pain or inflammation. Follow these instructions at home:  Use the PRICE method of treatment to promote muscle  healing during the first 2-3 days after your injury. The PRICE method involves: ? P-Protecting the muscle from being injured again. ? R-Restricting your activity and resting the injured body part. ? I-Icing your injury. To do this, put ice in a plastic bag. Place a towel between your skin and the bag. Then, apply the ice and leave it on for 20 minutes, 2-3 times per day. After the third day, switch to moist heat packs. ? C-Applying compression to the injured area with an elastic bandage. Be careful not to wrap it too tightly. That may interfere with blood circulation or may increase swelling. ? E-Elevating the injured body part above the level of your heart as often as you can. You can do this by putting a pillow under your thigh when you sit or lie down.  Take medicines only as directed by your health care provider.  Begin exercising or stretching as directed by your health care provider.  Do not return to full activity level until your health care provider approves.  Keep all follow-up visits as directed by your health care provider. This is important. Contact a health care provider if:  You have increasing pain or swelling in the injured area.  You have numbness, tingling, or a significant loss of strength in the injured area.  Your foot or your toes become cold or turn blue. This information is not intended to replace advice given to you by your health care provider. Make sure you discuss any questions you have with your health care provider. Document Released: 06/21/2001 Document Revised: 03/03/2016 Document Reviewed: 05/12/2014 Elsevier Interactive Patient Education  2018 [employer] Inc.  

## 2017-10-05 NOTE — Progress Notes (Signed)
Subjective:    Patient ID: Hunter Newman, male    DOB: 02-09-1981, 36 y.o.   MRN: [id number]   CC: Left thigh pain  HPI: Patient is 36 yo male who presents today complaining of left thigh pain. Patient reports that on christmas day he was pick up one of his daughter car and moving when he felt a sharp pain starting in his left buttock traveling down the back of his left thigh to his knee. Patient reports he started experiencing pain with ambulation.  Patient reports using ice and ibuprofen for pain control and did notice significant improvement in the first 24 hours.  Patient still experiencing pain mostly around his knee.  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  Patient denies any prior similar episode.  Smoking status reviewed   ROS: all other systems were reviewed and are negative other than in the HPI   Past Medical History:  Diagnosis Date  . Chronic bronchitis (HCC)   . Marijuana abuse   . Spontaneous pneumothorax 03/24/2015   left side  . Tobacco abuse     Past Surgical History:  Procedure Laterality Date  . CHEST TUBE INSERTION  03/24/2015  . DENTAL SURGERY    . EYE SURGERY Left    lac repaired  . EYE SURGERY    . STAPLING OF BLEBS Left 03/27/2015   Procedure: STAPLING OF BLEBS;  Surgeon: Kerin PernaPeter Van Trigt, MD;  [facility];  Service: Thoracic;  Laterality: Left;  Marland Kitchen. VIDEO ASSISTED THORACOSCOPY Left 03/27/2015   Procedure: VIDEO ASSISTED THORACOSCOPY;  Surgeon: Kerin PernaPeter Van Trigt, MD;  [facility];  Service: Thoracic;  Laterality: Left;    Past medical history, surgical, family, and social history reviewed and updated in the EMR as appropriate.  Objective:  BP 126/70   Pulse (!) 105   Temp 98.7 F (37.1 C) (Oral)   Wt 123 lb (55.8 kg)   SpO2 97%   BMI 19.26 kg/m   Vitals and nursing note reviewed  General: NAD, pleasant, able to participate in exam Cardiac:  RRR, normal heart sounds, no murmurs. 2+ radial and PT pulses bilaterally Respiratory: CTAB, normal effort, No wheezes, rales or rhonchi Abdomen: soft, nontender, nondistended, no hepatic or splenomegaly, +BS Extremities: no edema or cyanosis. WWP.  Concentric and eccentric strength exam were positive, with reproducible left thigh pain.  Range of motion was intact.  Limp noted during gait exam due to pain  Skin: warm and dry, no rashes noted Neuro: alert and oriented x4, no focal deficits Psych: Normal affect and mood   Assessment & Plan:   #Left thigh pain, acute, unchanged Patient presents with acute onset of left hamstring pain.  Patient initially use ice ibuprofen for pain with some improvement noted.  Patient continued to experience difficulty with ambulation as well as pain when lifting boxes for his work.  Symptoms are consistent with hamstring strain.  Discussed with patient need to change his daily work activities in order to give his hamstring a chance to heal.  Recommend rest, continue with ice as needed.  Also recommended thigh compression/sleeve.  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  Patient will follow up in 2 weeks if symptoms do not improve with above treatment plan.    Lovena NeighboursAbdoulaye Narek Kniss, MD  [facility] PGY-2

## 2019-02-22 ENCOUNTER — Telehealth (INDEPENDENT_AMBULATORY_CARE_PROVIDER_SITE_OTHER): Payer: BLUE CROSS/BLUE SHIELD | Admitting: Family Medicine

## 2019-02-22 ENCOUNTER — Other Ambulatory Visit: Payer: Self-pay

## 2019-02-22 ENCOUNTER — Encounter: Payer: Self-pay | Admitting: Family Medicine

## 2019-02-22 DIAGNOSIS — H01004 Unspecified blepharitis left upper eyelid: Secondary | ICD-10-CM | POA: Insufficient documentation

## 2019-02-22 NOTE — Assessment & Plan Note (Signed)
Low suspicion for preseptal cellulitis.  He reports no pain with eye movement, no visual changes, no purulence.  Likely viral/allergy related.  He was advised to use warm compresses to help with swelling in addition to Tylenol/Motrin for mild discomfort.  He is informed to seek medical attention if he notices any pain with eye movement, visual changes or purulent drainage.  Several minutes were spent in trying to share a photo of his eye via MyChart.  Ultimately, this was not successful.  He was encouraged to take a picture of his left eye to keep for his own records in case he does worsen.

## 2019-02-22 NOTE — Progress Notes (Signed)
[facility] Telemedicine Visit  Patient consented to have virtual visit. Method of visit: Telephone  Encounter participants: Patient: Hunter Newman - located at home Provider: Mirian Mo - located at [facility] Others (if applicable): mother, grandmother  Chief Complaint: Eyelid swelling  HPI:  Eyelid swelling [name] reports that he woke on 5/14 with a significantly swollen left upper eyelid, sore throat and body aches.  He also noted some nasal congestion with clear runny nose.  Multiple people commented on his mildly swollen left eyelid during the day.  Today, 5/15, he reports that his sore throat body aches have significantly improved although his eyelid remains about the same as yesterday.  He does not appear to have progressed or improved.  He specifically denies pain with eye movement, visual changes, conjunctival injection, thick white discharge, eye crusting.  He did not take any temperature at home but he denies feeling feverish.  No SOB, some cough (chronic same).  ROS: per HPI  Pertinent PMHx: none  Exam:  Respiratory: Able to complete long sentences without difficulty.  No respiratory distress appreciated.  No wheezing or cough noted.  Assessment/Plan:  Blepharitis of left upper eyelid Low suspicion for preseptal cellulitis.  He reports no pain with eye movement, no visual changes, no purulence.  Likely viral/allergy related.  He was advised to use warm compresses to help with swelling in addition to Tylenol/Motrin for mild discomfort.  He is informed to seek medical attention if he notices any pain with eye movement, visual changes or purulent drainage.  Several minutes were spent in trying to share a photo of his eye via MyChart.  Ultimately, this was not successful.  He was encouraged to take a picture of his left eye to keep for his own records in case he does worsen.    Time spent during visit with patient: 15 minutes

## 2019-02-27 ENCOUNTER — Encounter (HOSPITAL_COMMUNITY): Payer: Self-pay | Admitting: Emergency Medicine

## 2019-02-27 ENCOUNTER — Emergency Department (HOSPITAL_COMMUNITY)
Admission: EM | Admit: 2019-02-27 | Discharge: 2019-02-27 | Disposition: A | Discharge status: Home / Self Care | Payer: BLUE CROSS/BLUE SHIELD | Attending: Emergency Medicine | Admitting: Emergency Medicine

## 2019-02-27 ENCOUNTER — Other Ambulatory Visit: Payer: Self-pay

## 2019-02-27 DIAGNOSIS — M79605 Pain in left leg: Secondary | ICD-10-CM | POA: Diagnosis not present

## 2019-02-27 DIAGNOSIS — M79604 Pain in right leg: Secondary | ICD-10-CM | POA: Insufficient documentation

## 2019-02-27 MED ORDER — METHOCARBAMOL 500 MG PO TABS: 500.0000 mg | ORAL_TABLET | Freq: Two times a day (BID) | ORAL | 0 refills | Status: DC

## 2019-02-27 MED ORDER — DICLOFENAC SODIUM 1 % TD GEL: 4.0000 g | Freq: Four times a day (QID) | TRANSDERMAL | 1 refills | Status: DC

## 2019-02-27 MED ORDER — IBUPROFEN 600 MG PO TABS: 600.0000 mg | ORAL_TABLET | Freq: Four times a day (QID) | ORAL | 0 refills | Status: DC | PRN

## 2019-02-27 NOTE — Discharge Instructions (Signed)
Expect your soreness to increase over the next day or two. Take it easy, but do not lay around too much as this may make any stiffness worse.  Antiinflammatory medications: Take 600 mg of ibuprofen every 6 hours or 440 mg (over the counter dose) to 500 mg (prescription dose) of naproxen every 12 hours for the next 3 days. After this time, these medications may be used as needed for pain. Take these medications with food to avoid upset stomach. Choose only one of these medications, do not take them together. Acetaminophen (generic for Tylenol): Should you continue to have additional pain while taking the ibuprofen or naproxen, you may add in acetaminophen as needed. Your daily total maximum amount of acetaminophen from all sources should be limited to 4000mg /day for persons without liver problems, or 2000mg /day for those with liver problems. Methocarbamol: Methocarbamol (generic for Robaxin) is a muscle relaxer and can help relieve stiff muscles or muscle spasms.  Do not drive or perform other dangerous activities while taking this medication as it can cause drowsiness as well as changes in reaction time and judgement. Diclofenac gel: This is a topical anti-inflammatory medication and can be applied directly to the painful region.  Do not use on the face or genitals.  This medication may be used as an alternative to oral anti-inflammatory medications, such as ibuprofen or naproxen. Rest and elevation: Keep the lower extremities elevated and rested as often as possible. Follow up: Follow up with a primary care provider for any future management of these complaints. Be sure to follow up within 7-10 days.  If your primary care provider is uncomfortable with managing this issue, may follow-up with orthopedics. Return: Return to the ED should symptoms worsen.  For prescription assistance, may try using prescription discount sites or apps, such as goodrx.com

## 2019-02-27 NOTE — ED Provider Notes (Signed)
[facility] EMERGENCY DEPARTMENT Provider Note   CSN: [id number] Arrival date & time: 02/27/19  4098    History   Chief Complaint Chief Complaint  Patient presents with  . Leg Pain    HPI Hunter Newman is a 38 y.o. male.     HPI   Hunter Newman is a 38 y.o. male, presenting to the ED with bilateral leg pain beginning yesterday.  Pain is aching, throughout the calves, knees, and thighs, moderate in intensity, severe with walking.  Pain arose in both legs simultaneously. 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 Denies history of DVT/PE, recent surgery, recent trauma, hormone therapy, immobilization. Denies fever/chills, swelling, color change, shortness of breath, chest pain, other pain, numbness, weakness, or any other complaints.    Past Medical History:  Diagnosis Date  . Chronic bronchitis (HCC)   . Marijuana abuse   . Spontaneous pneumothorax 03/24/2015   left side  . Tobacco abuse     Patient Active Problem List   Diagnosis Date Noted  . Blepharitis of left upper eyelid 02/22/2019  . Spontaneous pneumothorax 03/27/2015  . Pneumothorax on left 03/24/2015  . Exposure to trichomonas 07/07/2014  . Rash and nonspecific skin eruption 05/22/2014  . H/O sexually transmitted disease 02/21/2013  . TOBACCO DEPENDENCE 12/07/2006    Past Surgical History:  Procedure Laterality Date  . CHEST TUBE INSERTION  03/24/2015  . DENTAL SURGERY    . EYE SURGERY Left    lac repaired  . EYE SURGERY    . STAPLING OF BLEBS Left 03/27/2015   Procedure: STAPLING OF BLEBS;  Surgeon: Kerin Perna, MD;  Location: [facility] OR;  Service: Thoracic;  Laterality: Left;  Marland Kitchen VIDEO ASSISTED THORACOSCOPY Left 03/27/2015   Procedure: VIDEO ASSISTED THORACOSCOPY;  Surgeon: Kerin Perna, MD;  Location: [facility] OR;  Service:  Thoracic;  Laterality: Left;        Home Medications    Prior to Admission medications   Medication Sig Start Date End Date Taking? Authorizing Provider  diclofenac sodium (VOLTAREN) 1 % GEL Apply 4 g topically 4 (four) times daily. 02/27/19   Antiono Ettinger C, PA-C  ibuprofen (ADVIL) 600 MG tablet Take 1 tablet (600 mg total) by mouth every 6 (six) hours as needed. 02/27/19   Mikail Goostree C, PA-C  methocarbamol (ROBAXIN) 500 MG tablet Take 1 tablet (500 mg total) by mouth 2 (two) times daily. 02/27/19   Neomi Laidler, Hillard Danker, PA-C    Family History Family History  Problem Relation Age of Onset  . Diabetes Father   . Cancer Father     Social History Social History   Tobacco Use  . Smoking status: Current Every Day Smoker    Packs/day: 0.30    Types: Cigarettes  . Smokeless tobacco: Current User  Substance Use Topics  . Alcohol use: Yes    Comment: occasionally  . Drug use: No     Allergies   Patient has no known allergies.   Review of Systems Review of Systems  Constitutional: Negative for fever.  Respiratory: Negative for cough and shortness of breath.   Cardiovascular: Negative for chest pain and leg swelling.  Gastrointestinal: Negative for abdominal pain, diarrhea, nausea and vomiting.  Musculoskeletal: Positive for myalgias. Negative for back pain.  Skin: Negative for color  change.  Neurological: Negative for weakness and numbness.     Physical Exam Updated Vital Signs BP (!) 138/103 (BP Location: Right Arm)   Pulse (!) 126   Temp 98.9 F (37.2 C) (Oral)   Resp 17   Ht 5\' 7"  (1.702 m)   Wt 59.4 kg   SpO2 98%   BMI 20.52 kg/m   Physical Exam Vitals signs and nursing note reviewed.  Constitutional:      General: He is not in acute distress.    Appearance: He is well-developed. He is not diaphoretic.  HENT:     Head: Normocephalic and atraumatic.  Eyes:     Conjunctiva/sclera: Conjunctivae normal.  Neck:     Musculoskeletal: Neck supple.  Cardiovascular:      Rate and Rhythm: Normal rate and regular rhythm.     Pulses:          Dorsalis pedis pulses are 2+ on the right side and 2+ on the left side.       Posterior tibial pulses are 2+ on the right side and 2+ on the left side.     Comments: Tactile temperature in each of the extremities is equal and appropriate. Patient's pulse was measured manually and noted to be 100. Patient states he does not like being in the emergency room. Pulmonary:     Effort: Pulmonary effort is normal.  Musculoskeletal:     Right lower leg: No edema.     Left lower leg: No edema.     Comments: Tenderness throughout the bilateral lower legs, quadriceps, and hamstrings.  No swelling, increased warmth, or erythema. Full range of motion without noted difficulty in the hips, knees, and ankles. No specific tenderness in the knees or ankles.  Skin:    General: Skin is warm and dry.     Coloration: Skin is not pale.  Neurological:     Mental Status: He is alert.     Comments: Sensation grossly intact to light touch in the lower extremities bilaterally. No saddle anesthesias. Strength 5/5 in the bilateral lower extremities. No noted gait deficit. Coordination intact with heel to shin testing.  Psychiatric:        Behavior: Behavior normal.      ED Treatments / Results  Labs (all labs ordered are listed, but only abnormal results are displayed) Labs Reviewed - No data to display  EKG None  Radiology No results found.  Procedures Procedures (including critical care time)  Medications Ordered in ED Medications - No data to display   Initial Impression / Assessment and Plan / ED Course  I have reviewed the triage vital signs and the nursing notes.  Pertinent labs & imaging results that were available during my care of the patient were reviewed by me and considered in my medical decision making (see chart for details).  Clinical Course as of Feb 26 802  Wed Feb 27, 2019  0802 This value was still  measured using the SPO2 sensor. This was not reflected when patient's pulse was measured manually.   Pulse Rate(!): 107 [SJ]    Clinical Course User Index [SJ] Najia Hurlbutt C, PA-C       Patient presents with bilateral lower extremity pain.  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  My suspicion is quite low for bilateral DVTs or simultaneous bilateral fractures.  We will begin a trial of anti-inflammatory medications, rest, and elevation.  He will follow-up with PCP in the symptoms worsen.  If symptoms worsen, he has been instructed to return to the ED. The patient was given instructions for home care as well as return precautions. Patient voices understanding of these instructions, accepts the plan, and is comfortable with discharge.   Patient's hypertension is noted.  This was discussed with him.  He states he does not like being in the emergency department.  He does not seem to be symptomatic to it.  He was instructed to follow-up with PCP on this matter.  Final Clinical Impressions(s) / ED Diagnoses   Final diagnoses:  Bilateral leg pain    ED Discharge Orders         Ordered    diclofenac sodium (VOLTAREN) 1 % GEL  4 times daily     02/27/19 0754    ibuprofen (ADVIL) 600 MG tablet  Every 6 hours PRN     02/27/19 0754    methocarbamol (ROBAXIN) 500 MG tablet  2 times daily     02/27/19 0754           Anselm PancoastJoy, Lynetta Tomczak C, PA-C 02/27/19 [id number]    Benjiman CorePickering, Nathan, MD 02/27/19 1530

## 2019-02-27 NOTE — ED Triage Notes (Signed)
Pt. Stated, I started having my legs to hurt on Thursday from my calves up, I call Family Practice and said I didn't need to come in. I went back to work on Monday cause it went away and came back on Tuesday and couldn't even walk.

## 2020-08-26 ENCOUNTER — Encounter: Payer: Self-pay | Admitting: Family Medicine

## 2020-08-26 ENCOUNTER — Other Ambulatory Visit (HOSPITAL_COMMUNITY)
Admission: RE | Admit: 2020-08-26 | Discharge: 2020-08-26 | Disposition: A | Discharge status: Home / Self Care | Payer: BC Managed Care – PPO | Source: Ambulatory Visit | Attending: Family Medicine | Admitting: Family Medicine

## 2020-08-26 ENCOUNTER — Other Ambulatory Visit: Payer: Self-pay

## 2020-08-26 ENCOUNTER — Ambulatory Visit: Payer: BLUE CROSS/BLUE SHIELD | Admitting: Family Medicine

## 2020-08-26 ENCOUNTER — Ambulatory Visit (INDEPENDENT_AMBULATORY_CARE_PROVIDER_SITE_OTHER): Payer: BC Managed Care – PPO | Admitting: Family Medicine

## 2020-08-26 VITALS — BP 142/80 | HR 58 | Ht 67.0 in | Wt 127.0 lb

## 2020-08-26 DIAGNOSIS — I10 Essential (primary) hypertension: Secondary | ICD-10-CM | POA: Insufficient documentation

## 2020-08-26 DIAGNOSIS — Z8619 Personal history of other infectious and parasitic diseases: Secondary | ICD-10-CM

## 2020-08-26 DIAGNOSIS — F17200 Nicotine dependence, unspecified, uncomplicated: Secondary | ICD-10-CM | POA: Diagnosis not present

## 2020-08-26 DIAGNOSIS — M25512 Pain in left shoulder: Secondary | ICD-10-CM | POA: Insufficient documentation

## 2020-08-26 DIAGNOSIS — R030 Elevated blood-pressure reading, without diagnosis of hypertension: Secondary | ICD-10-CM | POA: Diagnosis not present

## 2020-08-26 MED ORDER — IBUPROFEN 600 MG PO TABS: 600.0000 mg | ORAL_TABLET | Freq: Four times a day (QID) | ORAL | 0 refills | Status: DC | PRN

## 2020-08-26 MED ORDER — DICLOFENAC SODIUM 1 % EX GEL: 2.0000 g | Freq: Four times a day (QID) | CUTANEOUS | 1 refills | Status: DC

## 2020-08-26 NOTE — Assessment & Plan Note (Signed)
Patient currently smokes 1/4 pack, is in the contemplative stage of cessation.  Patient interested in cessation, has attempted nicotine patches in the past. -Counseled on cessation, will continue conversation at next visit.

## 2020-08-26 NOTE — Patient Instructions (Addendum)
It was a great seeing you today!  Today we discussed the following:  Shoulder pain: I am recommending that you start ibuprofen 600 mg throughout the day as you have pain.  I am also prescribing Voltaren gel to apply to the shoulders.  I am also recommend the below shoulder exercises and exercises to help with posture.     Follow-up for shoulder pain management as well as tobacco cessation counseling.    Shoulder Range of Motion Exercises Shoulder range of motion (ROM) exercises are done to keep the shoulder moving freely or to increase movement. They are often recommended for people who have shoulder pain or stiffness or who are recovering from a shoulder surgery. Phase 1 exercises When you are able, do this exercise 1-2 times per day for 30-60 seconds in each direction, or as directed by your health care provider. Pendulum exercise To do this exercise while sitting: 1. Sit in a chair or at the edge of your bed with your feet flat on the floor. 2. Let your affected arm hang down in front of you over the edge of the bed or chair. 3. Relax your shoulder, arm, and hand. 4. Rock your body so your arm gently swings in small circles. You can also use your unaffected arm to start the motion. 5. Repeat changing the direction of the circles, swinging your arm left and right, and swinging your arm forward and back. To do this exercise while standing: 1. Stand next to a sturdy chair or table, and hold on to it with your hand on your unaffected side. 2. Bend forward at the waist. 3. Bend your knees slightly. 4. Relax your shoulder, arm, and hand. 5. While keeping your shoulder relaxed, use body motion to swing your arm in small circles. 6. Repeat changing the direction of the circles, swinging your arm left and right, and swinging your arm forward and back. 7. Between exercises, stand up tall and take a short break to relax your lower back.  Phase 2 exercises Do these exercises 1-2 times per day or  as told by your health care provider. Hold each stretch for 30 seconds, and repeat 3 times. Do the exercises with one or both arms as instructed by your health care provider. For these exercises, sit at a table with your hand and arm supported by the table. A chair that slides easily or has wheels can be helpful. External rotation 1. Turn your chair so that your affected side is nearest to the table. 2. Place your forearm on the table to your side. Bend your elbow about 90 at the elbow (right angle) and place your hand palm facing down on the table. Your elbow should be about 6 inches away from your side. 3. Keeping your arm on the table, lean your body forward. Abduction 1. Turn your chair so that your affected side is nearest to the table. 2. Place your forearm and hand on the table so that your thumb points toward the ceiling and your arm is straight out to your side. 3. Slide your hand out to the side and away from you, using your unaffected arm to do the work. 4. To increase the stretch, you can slide your chair away from the table. Flexion: forward stretch 1. Sit facing the table. Place your hand and elbow on the table in front of you. 2. Slide your hand forward and away from you, using your unaffected arm to do the work. 3. To increase the stretch,  you can slide your chair backward. Phase 3 exercises Do these exercises 1-2 times per day or as told by your health care provider. Hold each stretch for 30 seconds, and repeat 3 times. Do the exercises with one or both arms as instructed by your health care provider. Cross-body stretch: posterior capsule stretch 1. Lift your arm straight out in front of you. 2. Bend your arm 90 at the elbow (right angle) so your forearm moves across your body. 3. Use your other arm to gently pull the elbow across your body, toward your other shoulder. Wall climbs 1. Stand with your affected arm extended out to the side with your hand resting on a door  frame. 2. Slide your hand slowly up the door frame. 3. To increase the stretch, step through the door frame. Keep your body upright and do not lean. Wand exercises You will need a cane, a piece of PVC pipe, or a sturdy wooden dowel for wand exercises. Flexion To do this exercise while standing: 1. Hold the wand with both of your hands, palms down. 2. Using the other arm to help, lift your arms up and over your head, if able. 3. Push upward with your other arm to gently increase the stretch. To do this exercise while lying down: 1. Lie on your back with your elbows resting on the floor and the wand in both your hands. Your hands will be palm down, or pointing toward your feet. 2. Lift your hands toward the ceiling, using your unaffected arm to help if needed. 3. Bring your arms overhead as able, using your unaffected arm to help if needed. Internal rotation 1. Stand while holding the wand behind you with both hands. Your unaffected arm should be extended above your head with the arm of the affected side extended behind you at the level of your waist. The wand should be pointing straight up and down as you hold it. 2. Slowly pull the wand up behind your back by straightening the elbow of your unaffected arm and bending the elbow of your affected arm. External rotation 1. Lie on your back with your affected upper arm supported on a small pillow or rolled towel. When you first do this exercise, keep your upper arm close to your body. Over time, bring your arm up to a 90 angle out to the side. 2. Hold the wand across your stomach and with both hands palm up. Your elbow on your affected side should be bent at a 90 angle. 3. Use your unaffected side to help push your forearm away from you and toward the floor. Keep your elbow on your affected side bent at a 90 angle. Contact a health care provider if you have:  New or increasing pain.  New numbness, tingling, weakness, or discoloration in your  arm or hand. This information is not intended to replace advice given to you by your health care provider. Make sure you discuss any questions you have with your health care provider. Document Revised: 11/08/2017 Document Reviewed: 11/08/2017 Elsevier Patient Education  2020 [employer].

## 2020-08-26 NOTE — Assessment & Plan Note (Signed)
2 months duration with increase in symptoms and limited ROM.  Likely MSK in origin with history of repetitive movement and strain.  Patient is young for diagnosis of arthritis, can consider but unlikely.  No red flag symptoms.  Do not currently feel that patient qualifies for imaging, may consider at future appointments if persistent or worsening pain. -Ibuprofen 600 mg every 6 hours for pain -Voltaren gel every 4 hours for pain -ROM exercises given to patient, several were demonstrated in the room. -Counseled on postural exercises as well.

## 2020-08-26 NOTE — Progress Notes (Signed)
SUBJECTIVE:   CHIEF COMPLAINT / HPI:   Shoulder pain (L>R) Patient reports that in the last 2 months he has had an onset of left shoulder pain that initially started out gradually and is now a consistent throbbing/achy pain.  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  He states he is still having pain, the pain is worse with most movements (especially with lifting boxes).  Patient was reports that this pain has awaken him from sleep, he is unsure if it is due to position changes, but states that he is able to go back to sleep after about 30 minutes.  He also reports now having a similar pain in the right shoulder intermittently.  Patient has not attempted any medications or treatment modalities..  STD screening: Patient does not know of any recent exposure to STDs, states that he has not gotten checked in about a year.  Currently in a reportedly monogamous relationship, no symptoms of STI.  Tobacco cessation: Patient reports that he is currently smoking 1/4 of a pack of cigarettes per day.  Patient reports that in the past he is tried cessation unsuccessfully with nicotine patches stating that the craving is his biggest problem.  He states he can go several hours while with his children without smoking, but when he leaves he has an intense craving.  Patient reports that he is interested in quitting, and would like to discuss this further at the next visit.    PERTINENT  PMH / PSH: Reviewed  OBJECTIVE:   BP (!) 142/80   Pulse (!) 58   Ht 5\' 7"  (1.702 m)   Wt 127 lb (57.6 kg)   SpO2 98%   BMI 19.89 kg/m   Gen: NAD, sitting comfortably in clinic, well-nourished MSK -Left shoulder: Decreased ROM with significant limitation in abduction, significantly reduced range with Apley scratch test, pain with passive abduction  and flexion.  AC joint tenderness with palpation, pain of the proximal medial LUE (possibly the short head of biceps) -Right shoulder: Minimal decrease in ROM with pain with passive and active abduction, minimally reduced Apley scratch test. -Back: Tender to palpation in bilateral suprascapular areas, bilateral trapezius tenderness -Neck: No pain with active or passive ROM of neck  ASSESSMENT/PLAN:   H/O sexually transmitted disease Patient request STD screening, reports last screening was 1 year ago.  No known exposure, currently in monogamous relationship. -HIV, RPR, GC/Ch, trichomoniasis ordered, will call patient with results.  Elevated blood pressure reading in office without diagnosis of hypertension BP elevated to 142/80, patient has no known history of HTN. -We will monitor at next visit -Patient advised to check blood pressure in the ambulatory setting.  Acute pain of left shoulder 2 months duration with increase in symptoms and limited ROM.  Likely MSK in origin with history of repetitive movement and strain.  Patient is young for diagnosis of arthritis, can consider but unlikely.  No red flag symptoms.  Do not currently feel that patient qualifies for imaging, may consider at future appointments if persistent or worsening pain. -Ibuprofen 600 mg every 6 hours for pain -Voltaren gel every 4 hours for pain -ROM exercises given to patient, several were demonstrated in the room. -Counseled on postural exercises as well.  TOBACCO DEPENDENCE Patient currently smokes 1/4 pack, is in the  contemplative stage of cessation.  Patient interested in cessation, has attempted nicotine patches in the past. -Counseled on cessation, will continue conversation at next visit.     Evelena Leyden, DO [facility]

## 2020-08-26 NOTE — Assessment & Plan Note (Addendum)
Patient request STD screening, reports last screening was 1 year ago.  No known exposure, currently in monogamous relationship. -HIV, RPR, GC/Ch, trichomoniasis ordered, will call patient with results.

## 2020-08-26 NOTE — Assessment & Plan Note (Signed)
BP elevated to 142/80, patient has no known history of HTN. -We will monitor at next visit -Patient advised to check blood pressure in the ambulatory setting.

## 2020-08-27 LAB — URINE CYTOLOGY ANCILLARY ONLY
Chlamydia: POSITIVE — AB
Comment: NEGATIVE
Comment: NEGATIVE
Comment: NORMAL
Neisseria Gonorrhea: NEGATIVE
Trichomonas: POSITIVE — AB

## 2020-08-27 LAB — RPR: RPR Ser Ql: NONREACTIVE

## 2020-08-27 LAB — HIV ANTIBODY (ROUTINE TESTING W REFLEX): HIV Screen 4th Generation wRfx: NONREACTIVE

## 2020-08-28 ENCOUNTER — Other Ambulatory Visit: Payer: Self-pay | Admitting: Family Medicine

## 2020-08-28 DIAGNOSIS — A599 Trichomoniasis, unspecified: Secondary | ICD-10-CM

## 2020-08-28 DIAGNOSIS — A749 Chlamydial infection, unspecified: Secondary | ICD-10-CM

## 2020-08-28 MED ORDER — METRONIDAZOLE 500 MG PO TABS: 2000.0000 mg | ORAL_TABLET | Freq: Once | ORAL | 0 refills | Status: AC

## 2020-08-28 MED ORDER — DOXYCYCLINE HYCLATE 100 MG PO TABS: 100.0000 mg | ORAL_TABLET | Freq: Two times a day (BID) | ORAL | 0 refills | Status: AC

## 2020-09-11 ENCOUNTER — Ambulatory Visit: Payer: BC Managed Care – PPO | Admitting: Family Medicine

## 2020-09-18 ENCOUNTER — Other Ambulatory Visit (HOSPITAL_COMMUNITY)
Admission: RE | Admit: 2020-09-18 | Discharge: 2020-09-18 | Disposition: A | Discharge status: Home / Self Care | Payer: BC Managed Care – PPO | Source: Ambulatory Visit | Attending: Family Medicine | Admitting: Family Medicine

## 2020-09-18 ENCOUNTER — Other Ambulatory Visit: Payer: Self-pay

## 2020-09-18 ENCOUNTER — Encounter: Payer: Self-pay | Admitting: Family Medicine

## 2020-09-18 ENCOUNTER — Ambulatory Visit: Payer: BC Managed Care – PPO | Admitting: Family Medicine

## 2020-09-18 VITALS — BP 130/70 | HR 99 | Ht 67.0 in | Wt 139.0 lb

## 2020-09-18 DIAGNOSIS — Z1159 Encounter for screening for other viral diseases: Secondary | ICD-10-CM

## 2020-09-18 DIAGNOSIS — Z23 Encounter for immunization: Secondary | ICD-10-CM

## 2020-09-18 DIAGNOSIS — Z0000 Encounter for general adult medical examination without abnormal findings: Secondary | ICD-10-CM

## 2020-09-18 DIAGNOSIS — Z113 Encounter for screening for infections with a predominantly sexual mode of transmission: Secondary | ICD-10-CM | POA: Insufficient documentation

## 2020-09-18 DIAGNOSIS — F17200 Nicotine dependence, unspecified, uncomplicated: Secondary | ICD-10-CM

## 2020-09-18 DIAGNOSIS — Z5320 Procedure and treatment not carried out because of patient's decision for unspecified reasons: Secondary | ICD-10-CM

## 2020-09-18 DIAGNOSIS — M25512 Pain in left shoulder: Secondary | ICD-10-CM

## 2020-09-18 MED ORDER — CHANTIX STARTING MONTH PAK 0.5 MG X 11 & 1 MG X 42 PO TABS: ORAL_TABLET | ORAL | 0 refills | Status: DC

## 2020-09-18 MED ORDER — TETANUS-DIPHTH-ACELL PERTUSSIS 5-2.5-18.5 LF-MCG/0.5 IM SUSP: 0.5000 mL | Freq: Once | INTRAMUSCULAR | 0 refills | Status: AC

## 2020-09-18 MED ORDER — VARENICLINE TARTRATE 1 MG PO TABS: 1.0000 mg | ORAL_TABLET | Freq: Two times a day (BID) | ORAL | 1 refills | Status: DC

## 2020-09-18 NOTE — Progress Notes (Addendum)
    SUBJECTIVE:   CHIEF COMPLAINT / HPI:   Smoking cessation He reports that he continues to smoke about 1/2 pack of cigarettes per day.  He is interested in cutting back and quitting.  He has tried a nicotine patch in the past without significant success.  This was helpful while he was in the hospital but he immediately went back to smoking after he stopped using the patches at hospital discharge.  Left shoulder pain He was told this was likely muscular shoulder pain at his last visit 2 weeks ago, since then, he has been treating it at home with Voltaren gel and Motrin.  He reports that it rarely bothers him now and he is pleased with his recovery.  STI testing He notes that he is sexually active not currently having any symptoms of penile discharge.  He like to be tested for STIs.  PERTINENT  PMH / PSH: Current smoker  OBJECTIVE:   BP 130/70   Pulse 99   Ht 5\' 7"  (1.702 m)   Wt 139 lb (63 kg)   SpO2 99%   BMI 21.77 kg/m   General: Alert and cooperative and appears to be in no acute distress Breathing comfortably on room air.  No respiratory distress Shoulder: Inspection: No gross abnormality or deformity Palpation: Nontender to palpation ROM: Full active range of motion Neurovascularly intact Special tests: Mildly uncomfortable Neer's test.  , Jeghers, speeds tests all negative. Extremities: No peripheral edema. Warm/ well perfused. Neuro: Cranial nerves grossly intact  ASSESSMENT/PLAN:   TOBACCO DEPENDENCE He is interested in starting Chantix today. -Chantix starter pack prescribed today -Quit date set for 10/10/2020 -Plan to return to clinic in mid January for reevaluation  Routine screening for STI (sexually transmitted infection) -Follow-up urine GC/chlamydia -Follow-up HIV, hep C  Acute pain of left shoulder Resolving nicely.  No further intervention needed at this time. -Continue Voltaren gel as needed -Motrin.     February, MD [facility]  [facility]

## 2020-09-18 NOTE — Assessment & Plan Note (Signed)
Resolving nicely.  No further intervention needed at this time. -Continue Voltaren gel as needed -Motrin.

## 2020-09-18 NOTE — Assessment & Plan Note (Signed)
He is interested in starting Chantix today. -Chantix starter pack prescribed today -Quit date set for 10/10/2020 -Plan to return to clinic in mid January for reevaluation

## 2020-09-18 NOTE — Patient Instructions (Signed)
Smoking cessation: I am happy to help you cut back and quitting her smoking. We can start the Chantix medication today. It is 1 pill daily. Be aware that this can cause reaming. I like that we set a stop date. Smoking on January first. Lets have you come back to clinic mid January or the end of January to see you doing.  STI screening: We will get a little bit of urine and blood work today to screen you for common STIs.  We will also get you a tetanus vaccine today.

## 2020-09-18 NOTE — Assessment & Plan Note (Signed)
-  Follow-up urine GC/chlamydia -Follow-up HIV, hep C

## 2020-09-19 LAB — HCV AB W REFLEX TO QUANT PCR: HCV Ab: <0.1 s/co ratio (ref 0.0–0.9)

## 2020-09-19 LAB — HIV ANTIBODY (ROUTINE TESTING W REFLEX): HIV Screen 4th Generation wRfx: NONREACTIVE

## 2020-09-19 LAB — HCV INTERPRETATION

## 2020-09-21 ENCOUNTER — Other Ambulatory Visit: Payer: Self-pay | Admitting: Family Medicine

## 2020-09-21 LAB — URINE CYTOLOGY ANCILLARY ONLY
Chlamydia: NEGATIVE
Comment: NEGATIVE
Comment: NEGATIVE
Comment: NORMAL
Neisseria Gonorrhea: NEGATIVE
Trichomonas: NEGATIVE

## 2020-10-21 ENCOUNTER — Other Ambulatory Visit: Payer: Self-pay | Admitting: Family Medicine

## 2020-11-13 ENCOUNTER — Other Ambulatory Visit: Payer: Self-pay | Admitting: Family Medicine

## 2021-05-31 ENCOUNTER — Other Ambulatory Visit (HOSPITAL_COMMUNITY)
Admission: RE | Admit: 2021-05-31 | Discharge: 2021-05-31 | Disposition: A | Discharge status: Home / Self Care | Payer: BC Managed Care – PPO | Source: Ambulatory Visit | Attending: Family Medicine | Admitting: Family Medicine

## 2021-05-31 ENCOUNTER — Other Ambulatory Visit: Payer: Self-pay

## 2021-05-31 ENCOUNTER — Ambulatory Visit (INDEPENDENT_AMBULATORY_CARE_PROVIDER_SITE_OTHER): Payer: BC Managed Care – PPO | Admitting: Family Medicine

## 2021-05-31 VITALS — BP 142/95 | HR 81 | Ht 67.0 in | Wt 127.8 lb

## 2021-05-31 DIAGNOSIS — Z113 Encounter for screening for infections with a predominantly sexual mode of transmission: Secondary | ICD-10-CM | POA: Diagnosis not present

## 2021-05-31 DIAGNOSIS — A749 Chlamydial infection, unspecified: Secondary | ICD-10-CM | POA: Diagnosis not present

## 2021-05-31 NOTE — Progress Notes (Signed)
    SUBJECTIVE:   CHIEF COMPLAINT / HPI:   Chief Complaint  Patient presents with   Std check    Hunter Newman is a 40 y.o. male presents for STD check.   STI check He recently became sexually active with a new partner, wants to be checked for STIs. Previously negative RPR, HIV in 08/2020. No symptoms in himself. Pt states it was new partner and the condom broke.  Had chlamydia last year and was treated. Has no other concerns.    PERTINENT  PMH / PSH: reviewed and updated as appropriate   OBJECTIVE:   BP (!) 142/95   Pulse 81   Ht 5\' 7"  (1.702 m)   Wt 127 lb 12.8 oz (58 kg)   SpO2 100%   BMI 20.02 kg/m   GEN: well appearing male in no acute distress  CVS: well perfused  RESP: speaking in full sentences without pause  ABD: soft, non-tender, non-distended, no palpable masses  SKIN: warm and dry    ASSESSMENT/PLAN:   No problem-specific Assessment & Plan notes found for this encounter.   High risk sexual behavior GC and chlamydia via urine. Pt urinated 30 minutes prior to arrival. Future order entered and urine cup provided. Offered penile swab but pt declined. HIV and RPR collected.  Offered ppx treatment but pt declined.  - Advised to abstain from coitus until his tests result.  - Will f/u on GC, CT and syphilis,  call in Rx if positive.  - Wear barrier protection       , DO [facility]

## 2021-05-31 NOTE — Patient Instructions (Signed)
Thank you for coming into the office today.  Wait 2 hours to urinate again.  Be sure to bring your sample back.   Take Care,   Dr Rachael Darby

## 2021-06-01 LAB — RPR: RPR Ser Ql: NONREACTIVE

## 2021-06-01 LAB — HIV ANTIBODY (ROUTINE TESTING W REFLEX): HIV Screen 4th Generation wRfx: NONREACTIVE

## 2021-06-01 NOTE — Addendum Note (Signed)
Addended by: Jennette Bill on: 06/01/2021 10:10 AM   Modules accepted: Orders

## 2021-06-02 LAB — URINE CYTOLOGY ANCILLARY ONLY
Chlamydia: POSITIVE — AB
Comment: NEGATIVE
Comment: NEGATIVE
Comment: NORMAL
Neisseria Gonorrhea: NEGATIVE
Trichomonas: NEGATIVE

## 2021-06-03 ENCOUNTER — Telehealth: Payer: Self-pay

## 2021-06-03 MED ORDER — AZITHROMYCIN 500 MG PO TABS: 1000.0000 mg | ORAL_TABLET | Freq: Once | ORAL | 0 refills | Status: AC

## 2021-06-03 NOTE — Telephone Encounter (Signed)
Patient calls nurse line requesting to speak with provider regarding results from recent visit on 8/23.   Please advise.   Veronda Prude, RN

## 2021-06-03 NOTE — Addendum Note (Signed)
Addended by: Katha Cabal D on: 06/03/2021 06:39 PM   Modules accepted: Orders

## 2021-06-07 ENCOUNTER — Telehealth: Payer: Self-pay

## 2021-06-07 NOTE — Telephone Encounter (Signed)
Patient calls nurse line requesting repeat dosage of azithromycin. Patient reports dropping initial dosage down the sink.   Please advise.   Veronda Prude, RN

## 2021-06-08 ENCOUNTER — Encounter: Payer: Self-pay | Admitting: Family Medicine

## 2021-06-08 MED ORDER — AZITHROMYCIN 500 MG PO TABS: 1000.0000 mg | ORAL_TABLET | Freq: Once | ORAL | 0 refills | Status: AC

## 2021-12-16 ENCOUNTER — Other Ambulatory Visit: Payer: Self-pay

## 2021-12-16 ENCOUNTER — Ambulatory Visit
Admission: EM | Admit: 2021-12-16 | Discharge: 2021-12-16 | Disposition: A | Discharge status: Home / Self Care | Payer: BC Managed Care – PPO | Attending: Physician Assistant | Admitting: Physician Assistant

## 2021-12-16 DIAGNOSIS — Z113 Encounter for screening for infections with a predominantly sexual mode of transmission: Secondary | ICD-10-CM | POA: Diagnosis present

## 2021-12-16 NOTE — ED Triage Notes (Signed)
Pt is here for STD testing. He does not report any symptoms at this time. ?

## 2021-12-16 NOTE — ED Provider Notes (Signed)
?[facility] ? ? ? ?CSN: [id number] ?Arrival date & time: 12/16/21  1025 ? ? ?  ? ?History   ?Chief Complaint ?Chief Complaint  ?Patient presents with  ? Exposure to STD  ? ? ?HPI ?Hunter Newman is a 41 y.o. male.  ? ?Patient here today for STD screening.  He denies any current symptoms.  He does not report any known exposure. ? ?The history is provided by the patient.  ? ?Past Medical History:  ?Diagnosis Date  ? Chronic bronchitis (HCC)   ? Marijuana abuse   ? Spontaneous pneumothorax 03/24/2015  ? left side  ? Tobacco abuse   ? ? ?Patient Active Problem List  ? Diagnosis Date Noted  ? Routine screening for STI (sexually transmitted infection) 09/18/2020  ? Acute pain of left shoulder 08/26/2020  ? Spontaneous pneumothorax 03/27/2015  ? Pneumothorax on left 03/24/2015  ? TOBACCO DEPENDENCE 12/07/2006  ? ? ?Past Surgical History:  ?Procedure Laterality Date  ? CHEST TUBE INSERTION  03/24/2015  ? DENTAL SURGERY    ? EYE SURGERY Left   ? lac repaired  ? EYE SURGERY    ? STAPLING OF BLEBS Left 03/27/2015  ? Procedure: STAPLING OF BLEBS;  Surgeon: Kerin Perna, MD;  Location: [facility] OR;  Service: Thoracic;  Laterality: Left;  ? VIDEO ASSISTED THORACOSCOPY Left 03/27/2015  ? Procedure: VIDEO ASSISTED THORACOSCOPY;  Surgeon: Kerin Perna, MD;  Location: [facility] OR;  Service: Thoracic;  Laterality: Left;  ? ? ? ? ? ?Home Medications   ? ?Prior to Admission medications   ?Not on File  ? ? ?Family History ?Family History  ?Problem Relation Age of Onset  ? Diabetes Father   ? Cancer Father   ? ? ?Social History ?Social History  ? ?Tobacco Use  ? Smoking status: Every Day  ?  Packs/day: 0.30  ?  Types: Cigarettes  ? Smokeless tobacco: Current  ?Substance Use Topics  ? Alcohol use: Yes  ?  Comment: occasionally  ? Drug use: No  ? ? ? ?Allergies   ?Patient has no known allergies. ? ? ?Review of Systems ?Review of Systems  ?Constitutional:  Negative for chills and fever.  ?Eyes:  Negative for discharge and redness.   ?Gastrointestinal:  Negative for abdominal pain, nausea and vomiting.  ?Genitourinary:  Negative for genital sores and penile discharge.  ?Musculoskeletal:  Negative for back pain.  ? ? ?Physical Exam ?Triage Vital Signs ?ED Triage Vitals  ?Enc Vitals Group  ?   BP   ?   Pulse   ?   Resp   ?   Temp   ?   Temp src   ?   SpO2   ?   Weight   ?   Height   ?   Head Circumference   ?   Peak Flow   ?   Pain Score   ?   Pain Loc   ?   Pain Edu?   ?   Excl. in GC?   ? ?No data found. ? ?Updated Vital Signs ?BP (!) 174/106 (BP Location: Left Arm)   Pulse 94   Temp 97.9 ?F (36.6 ?C) (Oral)   Resp 16   SpO2 100%  ?   ? ?Physical Exam ?Vitals and nursing note reviewed.  ?Constitutional:   ?   General: He is not in acute distress. ?   Appearance: Normal appearance. He is not ill-appearing.  ?HENT:  ?   Head: Normocephalic and  atraumatic.  ?Eyes:  ?   Conjunctiva/sclera: Conjunctivae normal.  ?Cardiovascular:  ?   Rate and Rhythm: Normal rate.  ?Pulmonary:  ?   Effort: Pulmonary effort is normal.  ?Neurological:  ?   Mental Status: He is alert.  ?Psychiatric:     ?   Mood and Affect: Mood normal.     ?   Behavior: Behavior normal.     ?   Thought Content: Thought content normal.  ? ? ? ?UC Treatments / Results  ?Labs ?(all labs ordered are listed, but only abnormal results are displayed) ?Labs Reviewed  ?HIV ANTIBODY (ROUTINE TESTING W REFLEX)  ?HEPATITIS PANEL, ACUTE  ?RPR  ?CYTOLOGY, (ORAL, ANAL, URETHRAL) ANCILLARY ONLY  ? ? ?EKG ? ? ?Radiology ?No results found. ? ?Procedures ?Procedures (including critical care time) ? ?Medications Ordered in UC ?Medications - No data to display ? ?Initial Impression / Assessment and Plan / UC Course  ?I have reviewed the triage vital signs and the nursing notes. ? ?Pertinent labs & imaging results that were available during my care of the patient were reviewed by me and considered in my medical decision making (see chart for details). ? ?  ?STD screening ordered as requested.  Will  await results for further recommendation.  Encouraged follow-up with any further concerns. ? ?Final Clinical Impressions(s) / UC Diagnoses  ? ?Final diagnoses:  ?Routine screening for STI (sexually transmitted infection)  ? ?Discharge Instructions   ?None ?  ? ?ED Prescriptions   ?None ?  ? ?PDMP not reviewed this encounter. ?  ?Tomi Bamberger, PA-C ?12/16/21 1101 ? ?

## 2021-12-17 LAB — CYTOLOGY, (ORAL, ANAL, URETHRAL) ANCILLARY ONLY
Chlamydia: NEGATIVE
Comment: NEGATIVE
Comment: NEGATIVE
Comment: NORMAL
Neisseria Gonorrhea: NEGATIVE
Trichomonas: NEGATIVE

## 2021-12-17 LAB — HIV ANTIBODY (ROUTINE TESTING W REFLEX): HIV Screen 4th Generation wRfx: NONREACTIVE

## 2021-12-17 LAB — RPR: RPR Ser Ql: NONREACTIVE

## 2021-12-31 ENCOUNTER — Ambulatory Visit: Payer: BC Managed Care – PPO | Admitting: Family Medicine

## 2021-12-31 ENCOUNTER — Other Ambulatory Visit (HOSPITAL_COMMUNITY)
Admission: RE | Admit: 2021-12-31 | Discharge: 2021-12-31 | Disposition: A | Discharge status: Home / Self Care | Payer: BC Managed Care – PPO | Source: Ambulatory Visit | Attending: Family Medicine | Admitting: Family Medicine

## 2021-12-31 ENCOUNTER — Other Ambulatory Visit: Payer: Self-pay

## 2021-12-31 VITALS — BP 126/70 | HR 105 | Wt 129.8 lb

## 2021-12-31 DIAGNOSIS — R059 Cough, unspecified: Secondary | ICD-10-CM | POA: Insufficient documentation

## 2021-12-31 DIAGNOSIS — Z113 Encounter for screening for infections with a predominantly sexual mode of transmission: Secondary | ICD-10-CM | POA: Insufficient documentation

## 2021-12-31 DIAGNOSIS — F17200 Nicotine dependence, unspecified, uncomplicated: Secondary | ICD-10-CM

## 2021-12-31 NOTE — Assessment & Plan Note (Signed)
Continues to smoke 0.5 packs per day for close to 20 years  ?Patient states he would like to quit  ?Counseled on benefits of smoking cessation  ? ?

## 2021-12-31 NOTE — Patient Instructions (Signed)
We will test for COVID today to evaluate for potential cause of your cough. ? ?Over the next few days I recommend continuing to use good hand hygiene and wearing a mask around others.  Also recommend drinking hot tea with honey to help with your cough.  You can also use over-the-counter medications to help with your cough symptoms.  Will be important for you to remain hydrated with oral fluids to help combat the episodes of diarrhea that she had 2 days ago. ? ?I will follow with you once the results of your STI testing are available.  Please use condoms with each sexual encounter to help prevent infection along with sexually transmitted infections. ? ? ?

## 2021-12-31 NOTE — Assessment & Plan Note (Signed)
HIV  ?RPR  ?GC urine testing collected today  ?Will follow up with results once available  ?

## 2021-12-31 NOTE — Assessment & Plan Note (Signed)
Will test for COVID per patient request  ?Recommended hand hygiene and using night time humidifier  ?Can use OTC cough medications, honey and tea  ?

## 2021-12-31 NOTE — Progress Notes (Signed)
? ? ?  SUBJECTIVE:  ? ?CHIEF COMPLAINT / HPI: cough ? ?COUGH ?Onset: day 5  ?Modifying factors: uses a fan to help with symptoms at night  ? ?Symptoms ?Productive: non productive  ?Wheezing: denies  ?Dyspnea: denies  ?Nasal discharge: denies  ?Fever: not sure, reports having night sweats since he was child  ?Sore throat: denies  ?Sick contacts: was out in large crowd for birthday celebration  ?Heartburn symptoms: denies  ?History of Asthma: denies  ?Reports smoking 1/2 pack of cigarettes per day for 18 years  ? ?Red Flags  ?Weight loss: no weight loss with review of chart  ?Hemoptysis: denies  ? ?Request for STI testing  ?Patient requests to have testing for HIV, RPR, gonorrhea and chlamydia. He reports being sexually active recently with new partner and would like to be tested. He denies penile discharge, urinary symptoms.  ? ?PERTINENT  PMH / PSH:  ?Tobacco use ?Hx of spontaneous pneumothorax ? ?OBJECTIVE:  ? ?BP 126/70   Pulse (!) 105   Wt 129 lb 12.8 oz (58.9 kg)   SpO2 97%   BMI 20.33 kg/m?   ?Physical Exam ?Vitals reviewed.  ?Constitutional:   ?   General: He is not in acute distress. ?   Appearance: Normal appearance. He is not ill-appearing or diaphoretic.  ?HENT:  ?   Nose: Nose normal.  ?   Mouth/Throat:  ?   Mouth: Mucous membranes are moist.  ?   Pharynx: No oropharyngeal exudate or posterior oropharyngeal erythema.  ?Eyes:  ?   Conjunctiva/sclera: Conjunctivae normal.  ?Cardiovascular:  ?   Rate and Rhythm: Normal rate and regular rhythm.  ?   Pulses: Normal pulses.  ?   Heart sounds: Normal heart sounds. No murmur heard. ?Pulmonary:  ?   Effort: Pulmonary effort is normal. No respiratory distress.  ?   Breath sounds: Normal breath sounds. No stridor. No wheezing, rhonchi or rales.  ?   Comments: No digital clubbing  ?Abdominal:  ?   General: Abdomen is flat. Bowel sounds are normal.  ?   Palpations: Abdomen is soft.  ?   Tenderness: There is no abdominal tenderness.  ?Musculoskeletal:  ?   Cervical  back: Normal range of motion.  ?Lymphadenopathy:  ?   Cervical: No cervical adenopathy.  ?Neurological:  ?   Mental Status: He is alert.  ? ? ?ASSESSMENT/PLAN:  ? ?Routine screening for STI (sexually transmitted infection) ?HIV  ?RPR  ?GC urine testing collected today  ?Will follow up with results once available  ? ?TOBACCO DEPENDENCE ?Continues to smoke 0.5 packs per day for close to 20 years  ?Patient states he would like to quit  ?Counseled on benefits of smoking cessation  ? ? ?Cough ?Will test for COVID per patient request  ?Recommended hand hygiene and using night time humidifier  ?Can use OTC cough medications, honey and tea  ?  ? ? ?Ronnald Ramp, MD ?[facility]  ?

## 2022-01-01 LAB — HIV ANTIBODY (ROUTINE TESTING W REFLEX): HIV Screen 4th Generation wRfx: NONREACTIVE

## 2022-01-01 LAB — NOVEL CORONAVIRUS, NAA: SARS-CoV-2, NAA: NOT DETECTED

## 2022-01-01 LAB — RPR: RPR Ser Ql: NONREACTIVE

## 2022-01-03 LAB — URINE CYTOLOGY ANCILLARY ONLY
Chlamydia: NEGATIVE
Comment: NEGATIVE
Comment: NORMAL
Neisseria Gonorrhea: NEGATIVE

## 2022-03-15 ENCOUNTER — Encounter: Payer: Self-pay | Admitting: *Deleted

## 2022-05-23 ENCOUNTER — Other Ambulatory Visit: Payer: Self-pay

## 2022-05-23 ENCOUNTER — Ambulatory Visit: Payer: BC Managed Care – PPO | Admitting: Family Medicine

## 2022-05-23 ENCOUNTER — Other Ambulatory Visit (HOSPITAL_COMMUNITY)
Admission: RE | Admit: 2022-05-23 | Discharge: 2022-05-23 | Disposition: A | Discharge status: Home / Self Care | Payer: BC Managed Care – PPO | Source: Ambulatory Visit | Attending: Family Medicine | Admitting: Family Medicine

## 2022-05-23 ENCOUNTER — Encounter: Payer: Self-pay | Admitting: Family Medicine

## 2022-05-23 VITALS — BP 137/100 | HR 94 | Wt 127.0 lb

## 2022-05-23 DIAGNOSIS — Z113 Encounter for screening for infections with a predominantly sexual mode of transmission: Secondary | ICD-10-CM

## 2022-05-23 DIAGNOSIS — R030 Elevated blood-pressure reading, without diagnosis of hypertension: Secondary | ICD-10-CM | POA: Diagnosis not present

## 2022-05-23 NOTE — Assessment & Plan Note (Signed)
GC, chlamydia, trich, HIV and RPR obtained today. Will follow up with patient when results available. Offered PrEP therapy and patient declines.

## 2022-05-23 NOTE — Assessment & Plan Note (Addendum)
BP elevated today x2. No prior history of HTN, although on chart review he has had several elevated readings in the past. -Advised patient to check BP at home -Bring written log to follow up appt with PCP in 2 weeks -Check baseline BMP today -Likely needs anti-hypertensive medications pending home BP readings

## 2022-05-23 NOTE — Progress Notes (Signed)
    SUBJECTIVE:   CHIEF COMPLAINT / HPI:   STI Testing -Requests routine STI testing -No symptoms (no rash, penile lesions or discharge, dysuria, etc) -Sexually active with 1 male partner -No known exposure -New partner recently -Condom broke so he wanted to be safe -H/o chlamydia in 2021, treated  PERTINENT  PMH / PSH: tobacco use  OBJECTIVE:   BP (!) 137/100   Pulse 94   Wt 127 lb (57.6 kg)   SpO2 100%   BMI 19.89 kg/m   General: NAD, pleasant, able to participate in exam Respiratory: No respiratory distress Skin: warm and dry, no rashes noted Psych: Normal affect and mood Neuro: grossly intact  ASSESSMENT/PLAN:   Elevated blood pressure reading in office without diagnosis of hypertension BP elevated today x2. No prior history of HTN, although on chart review he has had several elevated readings in the past. -Advised patient to check BP at home -Bring written log to follow up appt with PCP in 2 weeks -Check baseline BMP today -Likely needs anti-hypertensive medications pending home BP readings  Routine screening for STI (sexually transmitted infection) GC, chlamydia, trich, HIV and RPR obtained today. Will follow up with patient when results available. Offered PrEP therapy and patient declines.     Maury Dus, MD [facility]

## 2022-05-23 NOTE — Patient Instructions (Addendum)
Today we did routine testing for sexually transmitted infections. I will send you a MyChart message in approximately 2 days with the results or call if needed.  Your blood pressure was high today. This is something we should take seriously-- untreated high blood pressure puts you at risk of stroke, heart disease, and kidney disease.   Check your blood pressure once daily at home. Keep a written log of the numbers and bring it to your next appointment.   Take care, Dr Anner Crete

## 2022-05-24 LAB — BASIC METABOLIC PANEL
BUN/Creatinine Ratio: 11 (ref 9–20)
BUN: 13 mg/dL (ref 6–20)
CO2: 20 mmol/L (ref 20–29)
Calcium: 9.4 mg/dL (ref 8.7–10.2)
Chloride: 100 mmol/L (ref 96–106)
Creatinine, Ser: 1.16 mg/dL (ref 0.76–1.27)
Glucose: 107 mg/dL — ABNORMAL HIGH (ref 70–99)
Potassium: 3.9 mmol/L (ref 3.5–5.2)
Sodium: 136 mmol/L (ref 134–144)
eGFR: 82 mL/min/{1.73_m2} (ref >59)

## 2022-05-24 LAB — HIV ANTIBODY (ROUTINE TESTING W REFLEX): HIV Screen 4th Generation wRfx: NONREACTIVE

## 2022-05-24 LAB — URINE CYTOLOGY ANCILLARY ONLY
Chlamydia: NEGATIVE
Comment: NEGATIVE
Comment: NEGATIVE
Comment: NORMAL
Neisseria Gonorrhea: NEGATIVE
Trichomonas: NEGATIVE

## 2022-05-24 LAB — RPR: RPR Ser Ql: NONREACTIVE

## 2022-06-07 NOTE — Progress Notes (Unsigned)
    SUBJECTIVE:   CHIEF COMPLAINT / HPI:   Elevated BP Patient is a 41 y.o. male who present today for follow up of elevated BP.   Patient endorses {rwdmsmartlistproblems:24882}  Home medications include: None Patient endorses taking these medications as prescribed.*** Denies any headache, vision changes, shortness of breath, lower extremity swelling or chest pain   Most recent creatinine trend:  Lab Results  Component Value Date   CREATININE 1.16 05/23/2022   CREATININE 0.74 03/29/2015   CREATININE 0.76 03/28/2015   Patient {rwdoesdoesnot:24881} check blood pressure at home.  Patient has had a BMP in the past 1 year.  PERTINENT  PMH / PSH: ***  OBJECTIVE:   There were no vitals taken for this visit.  ***  ASSESSMENT/PLAN:   No problem-specific Assessment & Plan notes found for this encounter.     Levin Erp, MD [facility]

## 2022-06-08 ENCOUNTER — Encounter: Payer: Self-pay | Admitting: Student

## 2022-06-08 ENCOUNTER — Ambulatory Visit (INDEPENDENT_AMBULATORY_CARE_PROVIDER_SITE_OTHER): Payer: BC Managed Care – PPO | Admitting: Student

## 2022-06-08 VITALS — BP 114/66 | HR 91 | Wt 125.0 lb

## 2022-06-08 DIAGNOSIS — Z716 Tobacco abuse counseling: Secondary | ICD-10-CM | POA: Diagnosis not present

## 2022-06-08 DIAGNOSIS — F17200 Nicotine dependence, unspecified, uncomplicated: Secondary | ICD-10-CM

## 2022-06-08 DIAGNOSIS — M25562 Pain in left knee: Secondary | ICD-10-CM

## 2022-06-08 DIAGNOSIS — R030 Elevated blood-pressure reading, without diagnosis of hypertension: Secondary | ICD-10-CM | POA: Diagnosis not present

## 2022-06-08 MED ORDER — NICOTINE 14 MG/24HR TD PT24
14.0000 mg | MEDICATED_PATCH | Freq: Every day | TRANSDERMAL | 0 refills | Status: DC
Start: 2022-06-08 — End: 2022-07-11

## 2022-06-08 NOTE — Assessment & Plan Note (Signed)
Patient having knee pain after twisting motion.  No pain currently on exam.  We discussed that we can start off with the left knee x-ray to check for any osteoarthritic changes although this will not show degenerative meniscus/ligaments.  We discussed if he is having these issues continuing that we can refer him to sports medicine for ultrasound. -DG left knee

## 2022-06-08 NOTE — Assessment & Plan Note (Addendum)
Currently taking 0.5 packs a day, has tried Chantix in the past but does not want to try it again.  Discussed that smoking can increase his risk hypertension as well. -Nicotine patches 14 mg -Quit line number provided

## 2022-06-08 NOTE — Patient Instructions (Signed)
It was great to see you! Thank you for allowing me to participate in your care!   Our plans for today:  -Since holding nicotine patches for you and included quit line number -Your blood pressure was great today, we will watch it and then another 3 months, if able please take your blood pressure at home on your upper arm and not wrist as this can change readings as well -For your knee I can order a left knee x-ray and if worsening I can refer you to sports medicine for evaluation  Take care and seek immediate care sooner if you develop any concerns.  Levin Erp, MD

## 2022-06-08 NOTE — Assessment & Plan Note (Addendum)
Normotensive today 114/66 on first blood pressure check. -Take Home blood pressure readings and bring in log use arm cuff instead of wrist -Follow-up in 3 months

## 2022-07-09 ENCOUNTER — Other Ambulatory Visit: Payer: Self-pay | Admitting: Student

## 2022-07-09 DIAGNOSIS — Z716 Tobacco abuse counseling: Secondary | ICD-10-CM

## 2022-08-22 ENCOUNTER — Encounter: Payer: Self-pay | Admitting: Family Medicine

## 2022-08-22 ENCOUNTER — Other Ambulatory Visit (HOSPITAL_COMMUNITY)
Admission: RE | Admit: 2022-08-22 | Discharge: 2022-08-22 | Disposition: A | Discharge status: Home / Self Care | Payer: BC Managed Care – PPO | Source: Ambulatory Visit | Attending: Family Medicine | Admitting: Family Medicine

## 2022-08-22 ENCOUNTER — Ambulatory Visit: Payer: BC Managed Care – PPO | Admitting: Family Medicine

## 2022-08-22 VITALS — BP 146/102 | HR 90 | Ht 67.0 in | Wt 132.4 lb

## 2022-08-22 DIAGNOSIS — R030 Elevated blood-pressure reading, without diagnosis of hypertension: Secondary | ICD-10-CM | POA: Diagnosis not present

## 2022-08-22 DIAGNOSIS — Z113 Encounter for screening for infections with a predominantly sexual mode of transmission: Secondary | ICD-10-CM

## 2022-08-22 NOTE — Patient Instructions (Addendum)
It was nice seeing you today!  Continue to check your blood pressure at home at least a few times a week and write down the numbers if you can.  I recommend reducing alcohol intake which will improve your blood pressure.  Try to reduce sodium intake and recommend aerobic exercise at least 30 minutes a day for 5 days of the week.  Follow-up in 1 month.  Stay well, Littie Deeds, MD [facility] [phone]  --  Make sure to check out at the front desk before you leave today.  Please arrive at least 15 minutes prior to your scheduled appointments.  If you had blood work today, I will send you a MyChart message or a letter if results are normal. Otherwise, I will give you a call.  If you had a referral placed, they will call you to set up an appointment. Please give [place] a call if you don't hear back in the next 2 weeks.  If you need additional refills before your next appointment, please call your pharmacy first.

## 2022-08-22 NOTE — Progress Notes (Signed)
    SUBJECTIVE:   CHIEF COMPLAINT / HPI:  Chief Complaint  Patient presents with   Blood Pressure Check    BP was elevated in office 8/14, return 2 weeks later and office blood pressure was normal and he was advised to keep a BP log and follow-up.  He reports he has been checking his blood pressure at home with a wrist cuff and did write the numbers down but he forgot to bring the log today.  He reports he mostly gets good readings in the "green" zone, mostly 120s to 130s systolic.  Does not remember the diastolic number.  Highest reading at home was 146/90s.  He does not want to start medication today.  He is working on quitting smoking.  He also does drink alcohol mostly on the weekends but reports that he has been drinking more lately.  Over the weekend he did drink about a half a pint of hard alcohol.  He also would like STI screening today.  He denies any symptoms or new partners.  PERTINENT  PMH / PSH:   Patient Care Team: Levin Erp, MD as PCP - General (Family Medicine)   OBJECTIVE:   BP (!) 146/102   Pulse 90   Ht 5\' 7"  (1.702 m)   Wt 132 lb 6.4 oz (60.1 kg)   SpO2 100%   BMI 20.74 kg/m   Physical Exam Constitutional:      General: He is not in acute distress. Cardiovascular:     Rate and Rhythm: Normal rate and regular rhythm.  Pulmonary:     Effort: Pulmonary effort is normal. No respiratory distress.     Breath sounds: Normal breath sounds.  Neurological:     Mental Status: He is alert.         08/22/2022    8:27 AM  Depression screen PHQ 2/9  Decreased Interest 0  Down, Depressed, Hopeless 0  PHQ - 2 Score 0  Altered sleeping 0  Tired, decreased energy 0  Change in appetite 0  Feeling bad or failure about yourself  0  Trouble concentrating 0  Moving slowly or fidgety/restless 0  Suicidal thoughts 0  PHQ-9 Score 0  Difficult doing work/chores Not difficult at all     {Show previous vital signs (optional):23777}    ASSESSMENT/PLAN:    Elevated blood pressure reading in office without diagnosis of hypertension Elevated in office but home readings appear to be well controlled.  I think part of this is due to alcohol use which I counseled him on.  He does not want to start medications today. - reduce alcohol - counseled on diet and exercise, DASH diet handout provided - consider starting medication if BP elevated at follow-up or if home readings are elevated  Screening examination for STD (sexually transmitted disease)  Asymptomatic. - Plan: Urine cytology ancillary only, HIV antibody (with reflex), RPR   Return in about 4 weeks (around 09/19/2022) for f/u BP.   14/08/2022, MD [facility]

## 2022-08-22 NOTE — Assessment & Plan Note (Addendum)
Elevated in office but home readings appear to be well controlled.  I think part of this is due to alcohol use which I counseled him on.  He does not want to start medications today. - reduce alcohol - counseled on diet and exercise, DASH diet handout provided - consider starting medication if BP elevated at follow-up or if home readings are elevated

## 2022-08-23 LAB — URINE CYTOLOGY ANCILLARY ONLY
Chlamydia: NEGATIVE
Comment: NEGATIVE
Comment: NEGATIVE
Comment: NORMAL
Neisseria Gonorrhea: NEGATIVE
Trichomonas: NEGATIVE

## 2022-08-23 LAB — HIV ANTIBODY (ROUTINE TESTING W REFLEX): HIV Screen 4th Generation wRfx: NONREACTIVE

## 2022-08-23 LAB — RPR: RPR Ser Ql: NONREACTIVE

## 2022-09-19 NOTE — Patient Instructions (Incomplete)
It was nice seeing you today!  Start taking olmesartan-HCTZ once a day. Try to get a blood pressure machine and record your blood pressure readings.  Schedule a nurse visit in 2 weeks for blood pressure check and lab work.  Stay well, Littie Deeds, MD [facility] [phone]  --  Make sure to check out at the front desk before you leave today.  Please arrive at least 15 minutes prior to your scheduled appointments.  If you had blood work today, I will send you a MyChart message or a letter if results are normal. Otherwise, I will give you a call.  If you had a referral placed, they will call you to set up an appointment. Please give [place] a call if you don't hear back in the next 2 weeks.  If you need additional refills before your next appointment, please call your pharmacy first.  -- Blood Pressure Record Sheet To take your blood pressure, you will need a blood pressure machine. You can buy a blood pressure machine (blood pressure monitor) at your clinic, drug store, or online. When choosing one, consider: An automatic monitor that has an arm cuff. A cuff that wraps snugly around your upper arm. You should be able to fit only one finger between your arm and the cuff. A device that stores blood pressure reading results. Do not choose a monitor that measures your blood pressure from your wrist or finger. Follow your health care provider's instructions for how to take your blood pressure. To use this form: Take your blood pressure medications every day These measurements should be taken when you have been at rest for at least 10-15 min Take at least 2 readings with each blood pressure check. This makes sure the results are correct. Wait 1-2 minutes between measurements. Write down the results in the spaces on this form. Keep in mind it should always be recorded systolic over diastolic. Both numbers are important.  Repeat this every day for 2-3 weeks, or as told  by your health care provider.  Make a follow-up appointment with your health care provider to discuss the results.  Blood Pressure Log Date Medications taken? (Y/N) Blood Pressure Time of Day

## 2022-09-19 NOTE — Progress Notes (Unsigned)
    SUBJECTIVE:   CHIEF COMPLAINT / HPI:  No chief complaint on file.   I saw this patient 1 month ago.  He had elevated blood pressure in the office but had well-controlled ambulatory readings.  He did not want to start medication at that time and opted to recheck in 1 month so returns today.  PERTINENT  PMH / PSH: ***  Patient Care Team: Levin Erp, MD as PCP - General (Family Medicine)   OBJECTIVE:   There were no vitals taken for this visit.  Physical Exam      08/22/2022    8:27 AM  Depression screen PHQ 2/9  Decreased Interest 0  Down, Depressed, Hopeless 0  PHQ - 2 Score 0  Altered sleeping 0  Tired, decreased energy 0  Change in appetite 0  Feeling bad or failure about yourself  0  Trouble concentrating 0  Moving slowly or fidgety/restless 0  Suicidal thoughts 0  PHQ-9 Score 0  Difficult doing work/chores Not difficult at all     {Show previous vital signs (optional):23777}  {Labs  Heme  Chem  Endocrine  Serology  Results Review (optional):23779}  ASSESSMENT/PLAN:   No problem-specific Assessment & Plan notes found for this encounter.    No follow-ups on file.   Littie Deeds, MD [facility]

## 2022-09-20 ENCOUNTER — Ambulatory Visit: Payer: BC Managed Care – PPO | Admitting: Family Medicine

## 2022-09-20 ENCOUNTER — Other Ambulatory Visit (HOSPITAL_COMMUNITY)
Admission: RE | Admit: 2022-09-20 | Discharge: 2022-09-20 | Disposition: A | Discharge status: Home / Self Care | Payer: BC Managed Care – PPO | Source: Ambulatory Visit | Attending: Family Medicine | Admitting: Family Medicine

## 2022-09-20 VITALS — BP 153/94 | HR 84 | Ht 67.0 in | Wt 128.6 lb

## 2022-09-20 DIAGNOSIS — Z113 Encounter for screening for infections with a predominantly sexual mode of transmission: Secondary | ICD-10-CM | POA: Diagnosis present

## 2022-09-20 DIAGNOSIS — I10 Essential (primary) hypertension: Secondary | ICD-10-CM

## 2022-09-20 MED ORDER — OLMESARTAN MEDOXOMIL-HCTZ 20-12.5 MG PO TABS: 1.0000 | ORAL_TABLET | Freq: Every day | ORAL | 2 refills | Status: DC

## 2022-09-20 NOTE — Assessment & Plan Note (Signed)
BP remains elevated even on repeat.  Will initiate dual antihypertensive therapy ARB-thiazide combo.  Medication side effects discussed. - start olmesartan-HCTZ 20-12.5 mg daily - BMP today - return in 2 weeks for RN visit for BP check and labs, BMP future order placed

## 2022-09-21 LAB — URINE CYTOLOGY ANCILLARY ONLY
Chlamydia: NEGATIVE
Comment: NEGATIVE
Comment: NEGATIVE
Comment: NORMAL
Neisseria Gonorrhea: NEGATIVE
Trichomonas: NEGATIVE

## 2022-09-21 LAB — BASIC METABOLIC PANEL
BUN/Creatinine Ratio: 10 (ref 9–20)
BUN: 10 mg/dL (ref 6–20)
CO2: 22 mmol/L (ref 20–29)
Calcium: 9.8 mg/dL (ref 8.7–10.2)
Chloride: 102 mmol/L (ref 96–106)
Creatinine, Ser: 1.01 mg/dL (ref 0.76–1.27)
Glucose: 103 mg/dL — ABNORMAL HIGH (ref 70–99)
Potassium: 4 mmol/L (ref 3.5–5.2)
Sodium: 139 mmol/L (ref 134–144)
eGFR: 97 mL/min/{1.73_m2} (ref >59)

## 2022-09-21 LAB — RPR: RPR Ser Ql: NONREACTIVE

## 2022-09-21 LAB — HIV ANTIBODY (ROUTINE TESTING W REFLEX): HIV Screen 4th Generation wRfx: NONREACTIVE

## 2022-10-05 ENCOUNTER — Ambulatory Visit: Payer: BC Managed Care – PPO | Admitting: Family Medicine

## 2022-10-05 ENCOUNTER — Encounter: Payer: Self-pay | Admitting: Family Medicine

## 2022-10-05 VITALS — BP 128/64 | HR 110 | Ht 67.0 in | Wt 125.0 lb

## 2022-10-05 DIAGNOSIS — M25562 Pain in left knee: Secondary | ICD-10-CM

## 2022-10-05 DIAGNOSIS — I10 Essential (primary) hypertension: Secondary | ICD-10-CM

## 2022-10-05 NOTE — Progress Notes (Signed)
    SUBJECTIVE:   CHIEF COMPLAINT / HPI:  Chief Complaint  Patient presents with   Hypertension    Last visit patient was started on olmesartan-HCTZ 20-12.5 mg due to persistently elevated BP. Patient reports good adherence to his medications.  He has gotten a wrist BP cuff monitoring at home.  He is mostly getting readings in the 120s/80s. He did have some dizziness/lightheadedness when standing yesterday while at work.  He was lifting some heavy boxes.  He had his BP checked at work and it was in the 120s/80s but his pulse was 130.  No other issues with dizziness or lightheadedness.  PERTINENT  PMH / PSH: HTN  Patient Care Team: Levin Erp, MD as PCP - General (Family Medicine)   OBJECTIVE:   BP 128/64   Pulse (!) 110   Ht 5\' 7"  (1.702 m)   Wt 125 lb (56.7 kg)   SpO2 99%   BMI 19.58 kg/m   Physical Exam Constitutional:      General: He is not in acute distress. Cardiovascular:     Rate and Rhythm: Normal rate and regular rhythm.     Heart sounds: Normal heart sounds.     Comments: Pulse manually rechecked 96 Pulmonary:     Effort: Pulmonary effort is normal. No respiratory distress.     Breath sounds: Normal breath sounds.  Neurological:     Mental Status: He is alert.         10/05/2022    8:39 AM  Depression screen PHQ 2/9  Decreased Interest 0  Down, Depressed, Hopeless 0  PHQ - 2 Score 0  Altered sleeping 0  Tired, decreased energy 0  Change in appetite 0  Feeling bad or failure about yourself  0  Trouble concentrating 0  Moving slowly or fidgety/restless 0  Suicidal thoughts 0  PHQ-9 Score 0     {Show previous vital signs (optional):23777}    ASSESSMENT/PLAN:   Hypertension BP appears to be well-controlled.  He has had occasional lightheadedness/dizziness, advised patient to monitor his BP and symptoms and call if worsening. - continue olmesartan-HCTZ - BMP    Return in about 2 months (around 12/06/2022) for f/u HTN.   12/08/2022,  MD [facility]

## 2022-10-05 NOTE — Assessment & Plan Note (Signed)
BP appears to be well-controlled.  He has had occasional lightheadedness/dizziness, advised patient to monitor his BP and symptoms and call if worsening. - continue olmesartan-HCTZ - BMP

## 2022-10-05 NOTE — Patient Instructions (Addendum)
It was nice seeing you today!  Come back in 2 months for follow-up for your blood pressure.  If you are having more frequent dizziness or lightheadedness, or having a lot of blood pressure readings under 100 for the top number, give [place] a call.  Stay well, Littie Deeds, MD [facility] [phone]  --  Make sure to check out at the front desk before you leave today.  Please arrive at least 15 minutes prior to your scheduled appointments.  If you had blood work today, I will send you a MyChart message or a letter if results are normal. Otherwise, I will give you a call.  If you had a referral placed, they will call you to set up an appointment. Please give [place] a call if you don't hear back in the next 2 weeks.  If you need additional refills before your next appointment, please call your pharmacy first.  -- Blood Pressure Record Sheet To take your blood pressure, you will need a blood pressure machine. You can buy a blood pressure machine (blood pressure monitor) at your clinic, drug store, or online. When choosing one, consider: An automatic monitor that has an arm cuff. A cuff that wraps snugly around your upper arm. You should be able to fit only one finger between your arm and the cuff. A device that stores blood pressure reading results. Do not choose a monitor that measures your blood pressure from your wrist or finger. Follow your health care provider's instructions for how to take your blood pressure. To use this form: Take your blood pressure medications every day These measurements should be taken when you have been at rest for at least 10-15 min Take at least 2 readings with each blood pressure check. This makes sure the results are correct. Wait 1-2 minutes between measurements. Write down the results in the spaces on this form. Keep in mind it should always be recorded systolic over diastolic. Both numbers are important.  Repeat this every day for  2-3 weeks, or as told by your health care provider.  Make a follow-up appointment with your health care provider to discuss the results.  Blood Pressure Log Date Medications taken? (Y/N) Blood Pressure Time of Day

## 2022-10-06 LAB — BASIC METABOLIC PANEL
BUN/Creatinine Ratio: 17 (ref 9–20)
BUN: 20 mg/dL (ref 6–20)
CO2: 19 mmol/L — ABNORMAL LOW (ref 20–29)
Calcium: 9.8 mg/dL (ref 8.7–10.2)
Chloride: 95 mmol/L — ABNORMAL LOW (ref 96–106)
Creatinine, Ser: 1.21 mg/dL (ref 0.76–1.27)
Glucose: 90 mg/dL (ref 70–99)
Potassium: 4.5 mmol/L (ref 3.5–5.2)
Sodium: 136 mmol/L (ref 134–144)
eGFR: 78 mL/min/{1.73_m2} (ref >59)

## 2022-12-05 ENCOUNTER — Ambulatory Visit: Payer: BC Managed Care – PPO | Admitting: Family Medicine

## 2022-12-14 ENCOUNTER — Ambulatory Visit: Payer: BC Managed Care – PPO | Admitting: Family Medicine

## 2022-12-14 NOTE — Progress Notes (Deleted)
    SUBJECTIVE:   CHIEF COMPLAINT / HPI:  No chief complaint on file.   At last visit 2 months ago patient was maintained on olmesartan-HCTZ.  Was having some occasional lightheadedness and dizziness and was advised to monitor his BP.  PERTINENT  PMH / PSH: HTN, tobacco use  Patient Care Team: Gerrit Heck, MD as PCP - General (Family Medicine)   OBJECTIVE:   There were no vitals taken for this visit.  Physical Exam      10/05/2022    8:39 AM  Depression screen PHQ 2/9  Decreased Interest 0  Down, Depressed, Hopeless 0  PHQ - 2 Score 0  Altered sleeping 0  Tired, decreased energy 0  Change in appetite 0  Feeling bad or failure about yourself  0  Trouble concentrating 0  Moving slowly or fidgety/restless 0  Suicidal thoughts 0  PHQ-9 Score 0     {Show previous vital signs (optional):23777}  {Labs  Heme  Chem  Endocrine  Serology  Results Review (optional):23779}  ASSESSMENT/PLAN:   No problem-specific Assessment & Plan notes found for this encounter.    No follow-ups on file.   Zola Button, MD [place]

## 2022-12-19 ENCOUNTER — Other Ambulatory Visit: Payer: Self-pay | Admitting: Family Medicine

## 2023-01-05 ENCOUNTER — Other Ambulatory Visit: Payer: Self-pay | Admitting: Student

## 2023-05-11 NOTE — Progress Notes (Signed)
    SUBJECTIVE:   CHIEF COMPLAINT / HPI:   HTN Previously prescirbed Benicar 20-12.5 mg but stopped taking it about 6 months ago. Checks his BP at work on occasion - unsure of exact numbers but has been "in the green".   STD testing Wants routine testing to be safe, no current symptoms concerning for STDs. Does use condoms, 3 partners w/in last year. No interested in PrEP.   Tobacco use 1/2 PPD (down from 1PPD). Wants to quit completely.  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 Doesn't smoke as much at home because he won't smoke in the house and doesn't want to go outside.   PERTINENT  PMH / PSH: Tobacco dependence, HTN  OBJECTIVE:   BP 116/72   Pulse 82   Wt 124 lb (56.2 kg)   SpO2 98%   BMI 19.42 kg/m    General: NAD, pleasant, able to participate in exam Cardiac: Well-perfused Respiratory: Breathing comfortably on room air Skin: warm and dry Neuro: alert, no obvious focal deficits Psych: Normal affect and mood  ASSESSMENT/PLAN:   Hypertension Well-controlled off of medication.  Benicar removed from med list.  Patient advised to continue checking BP on occasion and return if consistently above 140/90.  TOBACCO DEPENDENCE Nicorette gum sent to pharmacy.  Patient advised to return for follow-up visit in 1 month with PCP to discuss smoking cessation further.   STD testing Routine, no current symptoms. -HIV, RPR -Urine GC/chlamydia  Dr. Erick Alley, DO [facility]

## 2023-05-12 ENCOUNTER — Ambulatory Visit: Payer: BC Managed Care – PPO | Admitting: Student

## 2023-05-12 ENCOUNTER — Other Ambulatory Visit (HOSPITAL_COMMUNITY)
Admission: RE | Admit: 2023-05-12 | Discharge: 2023-05-12 | Disposition: A | Discharge status: Home / Self Care | Payer: BC Managed Care – PPO | Source: Ambulatory Visit | Attending: Family Medicine | Admitting: Family Medicine

## 2023-05-12 VITALS — BP 116/72 | HR 82 | Wt 124.0 lb

## 2023-05-12 DIAGNOSIS — Z113 Encounter for screening for infections with a predominantly sexual mode of transmission: Secondary | ICD-10-CM

## 2023-05-12 DIAGNOSIS — F17200 Nicotine dependence, unspecified, uncomplicated: Secondary | ICD-10-CM

## 2023-05-12 DIAGNOSIS — I10 Essential (primary) hypertension: Secondary | ICD-10-CM

## 2023-05-12 DIAGNOSIS — Z114 Encounter for screening for human immunodeficiency virus [HIV]: Secondary | ICD-10-CM

## 2023-05-12 MED ORDER — NICOTINE POLACRILEX 4 MG MT GUM: 4.0000 mg | CHEWING_GUM | OROMUCOSAL | 0 refills | Status: DC | PRN

## 2023-05-12 NOTE — Assessment & Plan Note (Signed)
Well-controlled off of medication.  Benicar removed from med list.  Patient advised to continue checking BP on occasion and return if consistently above 140/90.

## 2023-05-12 NOTE — Assessment & Plan Note (Signed)
Nicorette gum sent to pharmacy.  Patient advised to return for follow-up visit in 1 month with PCP to discuss smoking cessation further.

## 2023-05-12 NOTE — Patient Instructions (Addendum)
It was great to see you! Thank you for allowing me to participate in your care!  I recommend that you always bring your medications to each appointment as this makes it easy to ensure you are on the correct medications and helps [place] not miss when refills are needed.  Our plans for today:  - you do not need blood pressure lowering medication at this time. Continue to check blood pressure on occasion, if it is consistently >140/90, return as you may need medication - I prescribed nicorette gum to help with smoking cessation. Use in place of cigarettes when you have a craving. -return in 1 month to follow up smoking cessation    We are checking some labs today, I will call you if they are abnormal will send you a MyChart message or a letter if they are normal.  If you do not hear about your labs in the next 2 weeks please let us know.  Take care and seek immediate care sooner if you develop any concerns.   Dr. Erick Alley, DO [facility]

## 2023-05-29 ENCOUNTER — Encounter: Payer: BC Managed Care – PPO | Admitting: Student

## 2023-05-29 NOTE — Progress Notes (Deleted)
    SUBJECTIVE:   Chief compliant/HPI: annual examination  Hunter Newman is a 42 y.o. who presents today for an annual exam.   HTN Previously on olmesartan-benicar daily but has been well controlled off this medication ***.  History tabs reviewed and updated ***.   Review of systems form reviewed and notable for ***.   OBJECTIVE:   There were no vitals taken for this visit.  ***  ASSESSMENT/PLAN:   No problem-specific Assessment & Plan notes found for this encounter.    Annual Examination  See AVS for recommendations.  PHQ score ***, reviewed.  Blood pressure value is *** goal.  Advanced directives ***   Considered the following screening exams based upon USPSTF recommendations: Diabetes screening: {discussed/ordered:14545} Screening for elevated cholesterol: {discussed/ordered:14545} HIV testing:  recetnly ordered-negative Hepatitis C: {discussed/ordered:14545} Hepatitis B: {discussed/ordered:14545} Syphilis if at high risk:  recently ordered-negative Reviewed risk factors for latent tuberculosis and {not indicated/requested/declined:14582} Colorectal cancer screening: {crcscreen:23821::"discussed, colonoscopy ordered"} if age 55 or over or risk factors.  Immunizations ***.   Follow up in 1 *** year or sooner if indicated.    Levin Erp, MD [facility]

## 2023-06-02 ENCOUNTER — Other Ambulatory Visit: Payer: Self-pay | Admitting: Student

## 2023-06-02 DIAGNOSIS — F17200 Nicotine dependence, unspecified, uncomplicated: Secondary | ICD-10-CM

## 2023-06-09 ENCOUNTER — Ambulatory Visit: Payer: BC Managed Care – PPO | Admitting: Student

## 2023-07-01 ENCOUNTER — Other Ambulatory Visit: Payer: Self-pay | Admitting: Student

## 2023-07-01 DIAGNOSIS — F17200 Nicotine dependence, unspecified, uncomplicated: Secondary | ICD-10-CM

## 2023-07-14 ENCOUNTER — Ambulatory Visit: Payer: BC Managed Care – PPO | Admitting: Student

## 2023-09-15 ENCOUNTER — Ambulatory Visit: Payer: BC Managed Care – PPO | Admitting: Student

## 2023-09-19 NOTE — Progress Notes (Unsigned)
    SUBJECTIVE:   CHIEF COMPLAINT / HPI: Follow up  He has been off Benicar since before his last visit and has not been monitoring his blood pressure at home due to recent life events. 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 This has caused significant stress and has led to a temporary relapse in his smoking habit, despite previous success with nicotine gum. He does request a refill of gum and does not want to try different option currently. He denies any symptoms of STDs but requests screening for peace of mind. Denies any new partners.  PERTINENT  PMH / PSH: HTN, pneumothorax  OBJECTIVE:   BP 135/70   Pulse 88   Ht 5\' 7"  (1.702 m)   Wt 129 lb 6.4 oz (58.7 kg)   SpO2 100%   BMI 20.27 kg/m   General: Well appearing, NAD, awake, alert, responsive to questions Head: Normocephalic atraumatic CV: Regular rate and rhythm no murmurs rubs or gallops Respiratory: Clear to ausculation bilaterally, no wheezes rales or crackles, chest rises symmetrically,  no increased work of breathing  ASSESSMENT/PLAN:   Assessment & Plan Primary hypertension Off Benicar since before last visit. Blood pressure mildly elevated today, Goal 130/80 possibly due to recent stressors. -Patient prefers to avoid medication, arrange for 24-hour ambulatory blood pressure monitoring to ensure blood pressure fine at home off of BP meds Screening for STDs (sexually transmitted diseases) No current symptoms or new partners, patient requests screening for reassurance. -Order gonorrhea and chlamydia urine test today. -Provide orders for HIV, syphilis, and hepatitis C screenings at [facility]. TOBACCO DEPENDENCE Recent increase in smoking due to stress from grandmother's passing. Previously found nicotine gum helpful. -Continue nicotine gum. -Provide resources for quit line for additional support. 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 Reports good support system. -Encourage continued use of support system during this time.   Levin Erp, MD [facility]

## 2023-09-20 ENCOUNTER — Other Ambulatory Visit (HOSPITAL_COMMUNITY)
Admission: RE | Admit: 2023-09-20 | Discharge: 2023-09-20 | Disposition: A | Discharge status: Home / Self Care | Payer: BC Managed Care – PPO | Source: Ambulatory Visit | Attending: Family Medicine | Admitting: Family Medicine

## 2023-09-20 ENCOUNTER — Encounter: Payer: Self-pay | Admitting: Student

## 2023-09-20 ENCOUNTER — Ambulatory Visit: Payer: BC Managed Care – PPO | Admitting: Student

## 2023-09-20 VITALS — BP 135/70 | HR 88 | Ht 67.0 in | Wt 129.4 lb

## 2023-09-20 DIAGNOSIS — F17200 Nicotine dependence, unspecified, uncomplicated: Secondary | ICD-10-CM

## 2023-09-20 DIAGNOSIS — Z634 Disappearance and death of family member: Secondary | ICD-10-CM | POA: Diagnosis not present

## 2023-09-20 DIAGNOSIS — I10 Essential (primary) hypertension: Secondary | ICD-10-CM

## 2023-09-20 DIAGNOSIS — Z113 Encounter for screening for infections with a predominantly sexual mode of transmission: Secondary | ICD-10-CM | POA: Insufficient documentation

## 2023-09-20 MED ORDER — NICOTINE POLACRILEX 4 MG MT GUM: 4.0000 mg | CHEWING_GUM | OROMUCOSAL | 1 refills | Status: DC | PRN

## 2023-09-20 NOTE — Assessment & Plan Note (Signed)
Recent increase in smoking due to stress from grandmother's passing. Previously found nicotine gum helpful. -Continue nicotine gum. -Provide resources for quit line for additional support.

## 2023-09-20 NOTE — Patient Instructions (Signed)
It was great to see you! Thank you for allowing me to participate in your care!   Our plans for today:  - We are getting screening tests today -I have resent your nicotine gum -We will follow you BP closely--if still elevated recommend evaluation for ambulatory BP cuff with Dr. Raymondo Band  Take care and seek immediate care sooner if you develop any concerns.  Levin Erp, MD

## 2023-09-20 NOTE — Assessment & Plan Note (Signed)
Off Benicar since before last visit. Blood pressure mildly elevated today, Goal 130/80 possibly due to recent stressors. -Patient prefers to avoid medication, arrange for 24-hour ambulatory blood pressure monitoring to ensure blood pressure fine at home off of BP meds

## 2023-09-21 LAB — URINE CYTOLOGY ANCILLARY ONLY
Chlamydia: NEGATIVE
Comment: NEGATIVE
Comment: NORMAL
Neisseria Gonorrhea: NEGATIVE

## 2023-09-29 ENCOUNTER — Ambulatory Visit: Payer: BC Managed Care – PPO | Admitting: Pharmacist

## 2024-01-05 ENCOUNTER — Other Ambulatory Visit (HOSPITAL_COMMUNITY)
Admission: RE | Admit: 2024-01-05 | Discharge: 2024-01-05 | Disposition: A | Discharge status: Home / Self Care | Source: Ambulatory Visit | Attending: Family Medicine | Admitting: Family Medicine

## 2024-01-05 ENCOUNTER — Ambulatory Visit (INDEPENDENT_AMBULATORY_CARE_PROVIDER_SITE_OTHER): Admitting: Student

## 2024-01-05 VITALS — BP 142/100 | HR 94 | Temp 98.9°F | Ht 67.0 in | Wt 133.0 lb

## 2024-01-05 DIAGNOSIS — I10 Essential (primary) hypertension: Secondary | ICD-10-CM | POA: Diagnosis not present

## 2024-01-05 DIAGNOSIS — Z113 Encounter for screening for infections with a predominantly sexual mode of transmission: Secondary | ICD-10-CM | POA: Diagnosis present

## 2024-01-05 NOTE — Assessment & Plan Note (Signed)
 Patient's blood pressure is not controlled today. BP: (!) 142/100. Goal of 130/80. Patient's medication regimen includes none. States his BP at home/work is controlled -Changes to current regimen include none, he does not want to be on medicine -Referral to pharmacy clinic due to Discrepancy between home and clinic BP for ambulatory BP monitor

## 2024-01-05 NOTE — Progress Notes (Signed)
    SUBJECTIVE:   CHIEF COMPLAINT / HPI: STD screening   Discussed the use of AI scribe software for clinical note transcription with the patient, who gave verbal consent to proceed.  History of Present Illness The patient, with a history of hypertension, presents for a routine check-up and sexually transmitted infection (STI) testing. He denies any current symptoms, including pain or discharge, and is not aware of any recent partners with STIs.  The patient has been off Benicar since December and has been monitoring his blood pressure at work, which has been in the 'green' range. He expresses a preference to avoid medications if possible.   PERTINENT  PMH / PSH: hx pneumothorax  OBJECTIVE:   BP (!) 142/100   Pulse 94   Temp 98.9 F (37.2 C)   Ht 5\' 7"  (1.702 m)   Wt 133 lb (60.3 kg)   SpO2 100%   BMI 20.83 kg/m   General: Well appearing, NAD, awake, alert, responsive to questions Head: Normocephalic atraumatic CV: Regular rate and rhythm no murmurs rubs or gallops Respiratory: Clear to ausculation bilaterally, no wheezes rales or crackles, chest rises symmetrically,  no increased work of breathing Abdomen: Soft, non-tender, non-distended, normoactive bowel sounds  Extremities: Moves upper and lower extremities freely, no edema in LE  ASSESSMENT/PLAN:   Assessment & Plan Screening for STD (sexually transmitted disease) Request STI testing, asymptomatic - Urine cytology ancillary only - Hepatitis C antibody (reflex, frozen specimen) - HIV antibody (with reflex) - RPR Primary hypertension Patient's blood pressure is not controlled today. BP: (!) 142/100. Goal of 130/80. Patient's medication regimen includes none. States his BP at home/work is controlled -Changes to current regimen include none, he does not want to be on medicine -Referral to pharmacy clinic due to Discrepancy between home and clinic BP for ambulatory BP monitor   Smoking Cessation Reducing smoking  Offered support for cessation.  Levin Erp, MD [facility]

## 2024-01-05 NOTE — Patient Instructions (Signed)
 It was great to see you! Thank you for allowing me to participate in your care!   Our plans for today:  - I will let you know what you lab results show - I have scheduled you to come back for a blood pressure cuff with Dr. Raymondo Band to see if your blood pressure needs to be treated!  Take care and seek immediate care sooner if you develop any concerns.  Levin Erp, MD

## 2024-01-06 LAB — HIV ANTIBODY (ROUTINE TESTING W REFLEX): HIV Screen 4th Generation wRfx: NONREACTIVE

## 2024-01-06 LAB — HCV INTERPRETATION

## 2024-01-06 LAB — HCV AB W REFLEX TO QUANT PCR: HCV Ab: NONREACTIVE

## 2024-01-06 LAB — RPR: RPR Ser Ql: NONREACTIVE

## 2024-01-08 ENCOUNTER — Encounter: Payer: Self-pay | Admitting: Student

## 2024-01-08 LAB — URINE CYTOLOGY ANCILLARY ONLY
Chlamydia: NEGATIVE
Comment: NEGATIVE
Comment: NEGATIVE
Comment: NORMAL
Neisseria Gonorrhea: NEGATIVE
Trichomonas: NEGATIVE

## 2024-01-12 ENCOUNTER — Ambulatory Visit: Admitting: Pharmacist

## 2024-01-25 ENCOUNTER — Telehealth: Payer: Self-pay | Admitting: Pharmacist

## 2024-01-25 NOTE — Telephone Encounter (Signed)
 Patient contacted for follow-up of missed appointment for ambulatory blood pressure monitoring. Patient rescheduled for 5/2 at 9am.  Current BP Medications include: none  Total time with patient call and documentation of interaction: 4 minutes.

## 2024-01-25 NOTE — Telephone Encounter (Signed)
 Duplicate. Error

## 2024-02-09 ENCOUNTER — Encounter: Payer: Self-pay | Admitting: Pharmacist

## 2024-02-09 ENCOUNTER — Ambulatory Visit (INDEPENDENT_AMBULATORY_CARE_PROVIDER_SITE_OTHER): Admitting: Pharmacist

## 2024-02-09 VITALS — BP 135/71 | Wt 131.4 lb

## 2024-02-09 DIAGNOSIS — I10 Essential (primary) hypertension: Secondary | ICD-10-CM | POA: Diagnosis not present

## 2024-02-09 NOTE — Patient Instructions (Signed)
 Blood Pressure Activity Diary Time Lying down/ Sleeping Walking/ Exercise Stressed/ Angry Headache/ Pain Dizzy  9 AM       10 AM       11 AM       12 PM       1 PM       2 PM       Time Lying down/ Sleeping Walking/ Exercise Stressed/ Angry Headache/ Pain Dizzy  3 PM       4 PM        5 PM       6 PM       7 PM       8 PM       Time Lying down/ Sleeping Walking/ Exercise Stressed/ Angry Headache/ Pain Dizzy  9 PM       10 PM       11 PM       12 AM       1 AM       2 AM       3 AM       Time Lying down/ Sleeping Walking/ Exercise Stressed/ Angry Headache/ Pain Dizzy  4 AM       5 AM       6 AM       7 AM       8 AM       9 AM       10 AM        Time you woke up: _________                  Time you went to sleep:__________  Come back tomorrow at 9:00 to have the monitor removed  Call the [facility] if you have any questions before then ([phone])  Wearing the Blood Pressure Monitor The cuff will inflate every 20 minutes during the day and every 30 minutes while you sleep. Fill out the blood pressure-activity diary during the day, especially during activities that may affect your reading -- such as exercise, stress, walking, taking your blood pressure medications  Important things to know: Avoid taking the monitor off for the next 24 hours, unless it causes you discomfort or pain. Do NOT get the monitor wet and do NOT try to clean the monitor with any cleaning products. Do NOT put the monitor on anyone else's arm. When the cuff inflates, avoid excess movement. Let the cuffed arm hang loosely, slightly away from the body. Avoid flexing the muscles or moving the hand/fingers. Remember to fill out the blood pressure activity diary. If you experience severe pain or unusual pain (not associated with getting your blood pressure checked), remove the monitor.  Troubleshooting:  Code  Troubleshooting   1  Check cuff position, tighten cuff   2, 3  Remain still  during reading   4, 87  Check air hose connections and make sure cuff is tight   85, 89  Check hose connections and make tubing is not crimped   86  Push START/STOP to restart reading   88, 91  Retry by pushing START/STOP   90  Replace batteries. If problem persists, remove monitor and bring back to   clinic at follow up   97, 98, 99  Service required - Remove monitor and bring back to clinic at follow up

## 2024-02-09 NOTE — Assessment & Plan Note (Signed)
 History of in-office elevated blood pressure readings. Goal presssure of <130/80 mm Hg.     -Placed blood pressure cuff, provided education, patient instructed to wear cuff for 24 hours and return tomorrow to review results.

## 2024-02-09 NOTE — Progress Notes (Signed)
   S:     Chief Complaint  Patient presents with   Medication Management    Amb BP Monitor Day#1    43 y.o. male who presents for hypertension evaluation, education, and management. Patient arrives in good spirits and presents without any assistance.    Patient was referred and last seen by Primary Care Provider, Dr. Leocadia Rains, on 01/05/2024.  At last visit, elevated blood pressure was discussed.   PMH is significant for tobacco use disorder.    Currently using nicotine  gum to assist with goal of tobacco cessation.   Discussed procedure for wearing the monitor and gave patient written instructions. Monitor was placed on non-dominant arm with instructions to return in the morning.   Current BP Medications include:  None   Plan to exercise more often at the gym as part of his tobacco cessation strategy.    O:  Review of Systems  All other systems reviewed and are negative.   Physical Exam Vitals reviewed.  Constitutional:      Appearance: Normal appearance. He is normal weight.  Pulmonary:     Effort: Pulmonary effort is normal.  Neurological:     Mental Status: He is alert.  Psychiatric:        Mood and Affect: Mood normal.        Behavior: Behavior normal.        Thought Content: Thought content normal.        Judgment: Judgment normal.     Last 3 Office BP readings: BP Readings from Last 3 Encounters:  02/09/24 135/71  01/05/24 (!) 142/100  09/20/23 135/70    Clinical Atherosclerotic Cardiovascular Disease (ASCVD): No  The ASCVD Risk score (Arnett DK, et al., 2019) failed to calculate for the following reasons:   Cannot find a previous HDL lab   Cannot find a previous total cholesterol lab  Basic Metabolic Panel    Component Value Date/Time   NA 136 10/05/2022 1026   K 4.5 10/05/2022 1026   CL 95 (L) 10/05/2022 1026   CO2 19 (L) 10/05/2022 1026   GLUCOSE 90 10/05/2022 1026   GLUCOSE 109 (H) 03/29/2015 0425   BUN 20 10/05/2022 1026   CREATININE 1.21  10/05/2022 1026   CALCIUM 9.8 10/05/2022 1026   GFRNONAA >60 03/29/2015 0425   GFRAA >60 03/29/2015 0425    Renal function: CrCl cannot be calculated (Patient's most recent lab result is older than the maximum 21 days allowed.).   ABPM Study Data: Arm Placement left arm  For Office Goal BP of <130/80 mmHg:  ABPM thresholds: Overall BP <125/75 mmHg, daytime BP <130/80 mmHg, sleeptime BP <110/65 mmHg   A/P: History of in-office elevated blood pressure readings. Goal presssure of <130/80 mm Hg.     -Placed blood pressure cuff, provided education, patient instructed to wear cuff for 24 hours and return tomorrow to review results.   Written patient instructions provided including activity/symptom/event log. Patient verbalized understanding of plan. Total time in face to face counseling 21 minutes.    Follow-up: [place] AM - early morning appointment 9:00  Patient seen with Noah [place], PharmD Candidate and Volney Grumbles, PharmD, PGY-1 pharmacy resident.

## 2024-02-09 NOTE — Progress Notes (Signed)
 Reviewed and agree with Dr Macky Lower plan.

## 2024-02-12 ENCOUNTER — Ambulatory Visit (INDEPENDENT_AMBULATORY_CARE_PROVIDER_SITE_OTHER): Admitting: Pharmacist

## 2024-02-12 ENCOUNTER — Encounter: Payer: Self-pay | Admitting: Pharmacist

## 2024-02-12 VITALS — BP 147/97 | HR 93

## 2024-02-12 DIAGNOSIS — F17200 Nicotine dependence, unspecified, uncomplicated: Secondary | ICD-10-CM

## 2024-02-12 DIAGNOSIS — I10 Essential (primary) hypertension: Secondary | ICD-10-CM

## 2024-02-12 MED ORDER — VALSARTAN 40 MG PO TABS: 40.0000 mg | ORAL_TABLET | Freq: Every day | ORAL | 1 refills | Status: DC

## 2024-02-12 NOTE — Assessment & Plan Note (Signed)
 History of hypertension. Had multiple previous evaluations based on elevated office readings as a longstanding  problem since 2021. Not currently taking any antihypertensive medication. Goal presssure of <130/80 mmHg.    Found to have persistently elevated and uncontrolled blood pressure with 24-hour ambulatory blood pressure evaluation which demonstrates an average AWAKE blood pressure of 147/97 mmHg. Nocturnal dipping pattern is normal with >20% decrease.  Started valsartan 40 mg once daily. Patient educated on purpose, proper use and potential adverse effects of angioedema. Following instruction patient verbalized understanding of treatment plan.

## 2024-02-12 NOTE — Patient Instructions (Signed)
 Nice to see you today!   Medication Changes: - START valsartan 40 mg 1 tablet once daily  - Continue all other medication the same.   Tobacco Patient Instructions  Quitting smoking is one of the most important decisions you can make for your current and future health. Consider what you dislike about smoking and how quitting could personally benefit you. Try to cut down.   Aim for reducing the amount you smoke to <5 cigarettes over the next 2 weeks.  Aim to avoid smoking in the car, finding something else to do while driving will help with this  My target quit date is: Last day of May (5/31).   Starting today, Be a Quitter!  Remind yourself why you want to quit.  Delay your first cigarette of the day for as long as possible.  Start cleaning out all pockets, drawers, and your car of cigarettes.  Getting Through the Cravings Once You Are Smoke Free: Each craving will last about 10 minutes, whether or not you smoke. Here's how to get through the cravings without cigarettes:  DELAY: Tell yourself that you'll wait for the next craving. Do it every time! DEEP BREATHS: One reason smoking feels good is because you breathe in deeply to inhale. Take four slow, deep breaths and feel the relaxation without the hamful effects of cigarettes. DRINK WATER: Drink a glass of cool water. It will give your hands and mouth something to do and will help flush the nicotine  out of your system faster. DIVERT: Do something else -- brush your teeth, take a walk, call a friend who can offer you support. Just moving onto something other than thinking about cigarettes will move you through the craving.   Frequently Asked Questions  What can I do when I get the urge to smoke? To get through the urge to smoke, try the following:  Review your reasons for quitting and think of all the benefits to your health, your finances, and your family.  Remind yourself that there is no such thing as just one cigarette -- or  even one puff.  Ride out the desire to smoke. Use the 4 Os -- Delay, Deep Breaths, Drink Water and Divert to get you through. The craving will go away eventually. Do not fool yourself into thinking you can have just one cigarette.  Any tips on how to deal with stress? Stress is a natural part of life. The key is to deal with it without reaching for a cigarette. Taking deep breaths, counting backwards from 10 and asking yourself 1-how big a deal is this?"  Writing down your feelings, talking with a friend and doing things like positive self-talk and meditation are some other ways that people deal with daily stress.  What if I start smoking again? Slips happen. Most people try to quit smoking a few times before they are successful. Don't beat yourself up if this happens to you! Ask yourself if this was a slip or a relapse. A slip is a one-time mistake that is quickly corrected. A relapse is going back to your old smoking habits.   If you slip, don't give up. Think of it as a learning experience. Ask yourself what went wrong and renew your commitment to staying away from smoking for good.  If you relapse, try not to get discouraged. Ask yourself the question "What caused me to start smoking?" Figure out what helped you and what didn't when you tried to quit. Knowing why you relapsed is  useful information for your next attempt to quit.  Please bring all medications to your clinic visits.  Please arrive 10-15 minutes prior to your scheduled visit time.

## 2024-02-12 NOTE — Progress Notes (Signed)
 S:     Chief Complaint  Patient presents with   Medication Management    Ambulatory BP monitor follow-up   43 y.o. male who presents for hypertension evaluation, education, and management.  Patient arrives in good spirits and presents without any assistance.  Patient returns to clinic with 24 hour blood pressure monitor and reports they were able to wear the ambulatory blood pressure cuff for the entire 24 evaluation period.    O:  Review of Systems  All other systems reviewed and are negative.   Physical Exam Vitals reviewed.  Constitutional:      Appearance: Normal appearance. He is normal weight.  Pulmonary:     Effort: Pulmonary effort is normal.  Neurological:     Mental Status: He is alert.  Psychiatric:        Mood and Affect: Mood normal.        Behavior: Behavior normal.        Thought Content: Thought content normal.        Judgment: Judgment normal.     Last 3 Office BP readings: BP Readings from Last 3 Encounters:  02/12/24 (!) 147/97  02/09/24 135/71  01/05/24 (!) 142/100    Clinical Atherosclerotic Cardiovascular Disease (ASCVD): No  The ASCVD Risk score (Arnett DK, et al., 2019) failed to calculate for the following reasons:   Cannot find a previous HDL lab   Cannot find a previous total cholesterol lab  Basic Metabolic Panel    Component Value Date/Time   NA 136 10/05/2022 1026   K 4.5 10/05/2022 1026   CL 95 (L) 10/05/2022 1026   CO2 19 (L) 10/05/2022 1026   GLUCOSE 90 10/05/2022 1026   GLUCOSE 109 (H) 03/29/2015 0425   BUN 20 10/05/2022 1026   CREATININE 1.21 10/05/2022 1026   CALCIUM 9.8 10/05/2022 1026   GFRNONAA >60 03/29/2015 0425   GFRAA >60 03/29/2015 0425    Renal function: CrCl cannot be calculated (Patient's most recent lab result is older than the maximum 21 days allowed.).   ABPM Study Data: Arm Placement left arm  Overall Mean 24hr BP:   129/83 mmHg  HR: 92  Daytime Mean BP:  147/97 mmHg  HR: 93  Nighttime Mean  BP:  95/57 mmHg  HR: 89  Dipping Pattern: Yes.    Sys:   -52 mmHg (~35%)   Dia: -40 mmHg (~41%) [normal dipping ~10-20%]   For Office Goal BP of <130/80 mmHg:  ABPM thresholds: Overall BP <125/75 mmHg, daytime BP <130/80 mmHg, sleeptime BP <110/65 mmHg    HTN A/P: History of hypertension. Had multiple previous evaluations based on elevated office readings as a longstanding  problem since 2021. Not currently taking any antihypertensive medication. Goal presssure of <130/80 mmHg.    Found to have persistently elevated and uncontrolled blood pressure with 24-hour ambulatory blood pressure evaluation which demonstrates an average AWAKE blood pressure of 147/97 mmHg. Nocturnal dipping pattern is normal with >20% decrease.  Started valsartan 40 mg once daily. Patient educated on purpose, proper use and potential adverse effects of angioedema. Following instruction patient verbalized understanding of treatment plan.    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 Patient who is good candidate for success because of current level of motivation. Continued nicotine  replacement tx with cinnamon nicotine  gum. Patient counseled on purpose, proper use, and potential adverse effects, including GI side effects. Counseled on importance of park  and chew technique.     Written patient instructions provided. Patient verbalized understanding of treatment plan.  Total time in face to face counseling 32 minutes.    Follow-up:  Pharmacist 6/3 with Dr. Alice Innocent PCP clinic TBD Patient seen with Noah [place], PharmD Candidate.

## 2024-02-12 NOTE — Assessment & Plan Note (Signed)
 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 Patient who is good candidate for success because of current level of motivation. Continued nicotine  replacement tx with cinnamon nicotine  gum. Patient counseled on purpose, proper use, and potential adverse effects, including GI side effects. Counseled on importance of park and chew technique.

## 2024-02-21 NOTE — Progress Notes (Signed)
 Reviewed and agree with Dr Macky Lower plan.

## 2024-02-28 ENCOUNTER — Other Ambulatory Visit (HOSPITAL_COMMUNITY)
Admission: RE | Admit: 2024-02-28 | Discharge: 2024-02-28 | Disposition: A | Discharge status: Home / Self Care | Source: Ambulatory Visit | Attending: Family Medicine | Admitting: Family Medicine

## 2024-02-28 ENCOUNTER — Ambulatory Visit: Admitting: Student

## 2024-02-28 ENCOUNTER — Encounter: Payer: Self-pay | Admitting: Student

## 2024-02-28 VITALS — BP 137/97 | HR 113 | Ht 67.0 in | Wt 132.0 lb

## 2024-02-28 DIAGNOSIS — Z113 Encounter for screening for infections with a predominantly sexual mode of transmission: Secondary | ICD-10-CM

## 2024-02-28 DIAGNOSIS — I10 Essential (primary) hypertension: Secondary | ICD-10-CM | POA: Diagnosis not present

## 2024-02-28 NOTE — Progress Notes (Signed)
  SUBJECTIVE:   CHIEF COMPLAINT / HPI:   Hunter Newman is a 43 year old male who presents for STD testing.  STD testing: He had a recent sexual encounter around Mother's Day, approximately two weeks ago, with a male partner. He has no current symptoms such as discharge.  He has a past medical history of chlamydia, which was completely treated. He regularly undergoes STD testing and is familiar with the process.  Hypertension: Home medications include: Valsartan  40 mg daily. He endorses taking these medications as prescribed.    PERTINENT  PMH / PSH: Tobacco use disorder, HTN  OBJECTIVE:  BP (!) 137/97   Pulse (!) 113   Ht 5\' 7"  (1.702 m)   Wt 132 lb (59.9 kg)   SpO2 100%   BMI 20.67 kg/m  General: Well-appearing, NAD  ASSESSMENT/PLAN:   Assessment & Plan Primary hypertension BP: (!) 137/97 today. Poorly controlled. Goal of <130/80. Medication regimen: Valsartan  40 mg daily.  Continue management by Dr. Koval.  No recent kidney BMP, checked today as he is getting blood work anyway. Routine screening for STI (sexually transmitted infection) Not interested in PrEP therapy.  Obtain urine cytology for gonorrhea/committee/trichomonas, blood work for HIV/RPR/hepatitis B&C.  Veronia Goon, DO 02/28/2024, 9:53 AM PGY-3,  Family Medicine

## 2024-02-28 NOTE — Patient Instructions (Signed)
 It was great to see you today! Thank you for choosing [facility] for your primary care.  Today we addressed: STD testing today.  I would consider preexposure prophylaxis just as a way to limit your risk in the future.  Additionally, condoms. I am checking your kidney function today, please continue take your blood pressure medication and follow-up with Dr. Ulice Gamer during your scheduled time  If you haven't already, sign up for My Chart to have easy access to your labs results, and communication with your primary care physician.  Please arrive 15 minutes before your appointment to ensure smooth check in process.  We appreciate your efforts in making this happen.  Thank you for allowing me to participate in your care, Veronia Goon, DO 02/28/2024, 9:51 AM PGY-3, [facility]

## 2024-02-28 NOTE — Assessment & Plan Note (Signed)
 BP: (!) 137/97 today. Poorly controlled. Goal of <130/80. Medication regimen: Valsartan  40 mg daily.  Continue management by Dr. Koval.  No recent kidney BMP, checked today as he is getting blood work anyway.

## 2024-02-29 LAB — BASIC METABOLIC PANEL WITH GFR
BUN/Creatinine Ratio: 15 (ref 9–20)
BUN: 14 mg/dL (ref 6–24)
CO2: 20 mmol/L (ref 20–29)
Calcium: 9.4 mg/dL (ref 8.7–10.2)
Chloride: 107 mmol/L — ABNORMAL HIGH (ref 96–106)
Creatinine, Ser: 0.93 mg/dL (ref 0.76–1.27)
Glucose: 102 mg/dL — ABNORMAL HIGH (ref 70–99)
Potassium: 4.6 mmol/L (ref 3.5–5.2)
Sodium: 142 mmol/L (ref 134–144)
eGFR: 106 mL/min/{1.73_m2} (ref >59)

## 2024-02-29 LAB — URINE CYTOLOGY ANCILLARY ONLY
Chlamydia: NEGATIVE
Comment: NEGATIVE
Comment: NEGATIVE
Comment: NORMAL
Neisseria Gonorrhea: NEGATIVE
Trichomonas: POSITIVE — AB

## 2024-02-29 LAB — HCV AB W REFLEX TO QUANT PCR: HCV Ab: NONREACTIVE

## 2024-02-29 LAB — HCV INTERPRETATION

## 2024-02-29 LAB — RPR: RPR Ser Ql: NONREACTIVE

## 2024-02-29 LAB — HIV ANTIBODY (ROUTINE TESTING W REFLEX): HIV Screen 4th Generation wRfx: NONREACTIVE

## 2024-02-29 LAB — HEPATITIS B SURFACE ANTIGEN: Hepatitis B Surface Ag: NEGATIVE

## 2024-03-01 ENCOUNTER — Ambulatory Visit: Payer: Self-pay | Admitting: Student

## 2024-03-01 DIAGNOSIS — A599 Trichomoniasis, unspecified: Secondary | ICD-10-CM

## 2024-03-01 MED ORDER — METRONIDAZOLE 500 MG PO TABS: 2000.0000 mg | ORAL_TABLET | Freq: Once | ORAL | 0 refills | Status: AC

## 2024-03-01 NOTE — Telephone Encounter (Signed)
 Patient calls nurse line returning a call.   Patient advised of positive Trichomonas results.   Patient advised of medication at the pharmacy, advised to take all at once and with food.   Advised to refrain from sexual practices until all partner(s) are treated.   He voiced understanding.

## 2024-03-01 NOTE — Addendum Note (Signed)
 Addended by: Iantha Mainland on: 03/01/2024 08:56 AM   Modules accepted: Orders

## 2024-03-05 ENCOUNTER — Other Ambulatory Visit: Payer: Self-pay | Admitting: Family Medicine

## 2024-03-05 DIAGNOSIS — I10 Essential (primary) hypertension: Secondary | ICD-10-CM

## 2024-03-12 ENCOUNTER — Ambulatory Visit: Admitting: Pharmacist

## 2024-03-19 ENCOUNTER — Encounter: Payer: Self-pay | Admitting: *Deleted

## 2024-03-26 ENCOUNTER — Encounter: Payer: Self-pay | Admitting: Pharmacist

## 2024-03-26 ENCOUNTER — Ambulatory Visit: Admitting: Pharmacist

## 2024-03-26 VITALS — BP 148/92 | HR 72 | Wt 129.0 lb

## 2024-03-26 DIAGNOSIS — I10 Essential (primary) hypertension: Secondary | ICD-10-CM

## 2024-03-26 DIAGNOSIS — F17200 Nicotine dependence, unspecified, uncomplicated: Secondary | ICD-10-CM | POA: Diagnosis not present

## 2024-03-26 MED ORDER — NICOTINE POLACRILEX 4 MG MT GUM: 4.0000 mg | CHEWING_GUM | OROMUCOSAL | 1 refills | Status: DC | PRN

## 2024-03-26 MED ORDER — OLMESARTAN MEDOXOMIL-HCTZ 20-12.5 MG PO TABS: 1.0000 | ORAL_TABLET | Freq: Every day | ORAL | Status: DC

## 2024-03-26 NOTE — Progress Notes (Signed)
 S:     Chief Complaint  Patient presents with   Medication Management    Hypertension and Tobacco Intake Reduction   43 y.o. male who presents for hypertension evaluation, education, and management.   Patient was referred and last seen by Primary Care Provider, Dr. Janae Mclean, on 02/28/2024.  At last visit, with me, patient was re-initiated on blood pressure lowering therapy.  He was also interested in tobacco intake reduction.   Today, patient arrives in spirits and presents without assistance. Denies dizziness, headache, blurred vision, swelling.    Medication adherence reported as good . Patient reports taking BP medications today.   Current antihypertensives include: valsartan  40mg  daily  Antihypertensives tried in the past include: olmesartan  and hydrochlorothiazide   Reported home BP readings: now in the yellow zone at work (no longer in the red) - does not know the number values.  O:  Review of Systems  All other systems reviewed and are negative.   Physical Exam Vitals reviewed.  Constitutional:      Appearance: Normal appearance.  Pulmonary:     Effort: Pulmonary effort is normal.   Neurological:     Mental Status: He is alert.   Psychiatric:        Mood and Affect: Mood normal.        Behavior: Behavior normal.        Thought Content: Thought content normal.        Judgment: Judgment normal.    Last 3 Office BP readings: BP Readings from Last 3 Encounters:  03/26/24 (!) 148/92  02/28/24 (!) 137/97  02/12/24 (!) 147/97    BMET    Component Value Date/Time   NA 142 02/28/2024 1004   K 4.6 02/28/2024 1004   CL 107 (H) 02/28/2024 1004   CO2 20 02/28/2024 1004   GLUCOSE 102 (H) 02/28/2024 1004   GLUCOSE 109 (H) 03/29/2015 0425   BUN 14 02/28/2024 1004   CREATININE 0.93 02/28/2024 1004   CALCIUM 9.4 02/28/2024 1004   GFRNONAA >60 03/29/2015 0425   GFRAA >60 03/29/2015 0425    Clinical ASCVD: No    A/P: Hypertension recently restarted on  blood pressure lowering therapy and currently reports improved control based on at work readings.  Denies any side effects of therapy.  BP goal < 130/80  mmHg. Medication adherence appears good. Control is suboptimal due to suboptimal regimen.  - Stop Valsartan   - Restart olmesartan  hydrochlorothiazide as previously prescribed. Patient has supply remaining and is willing to use this as initial therapy.  -Patient educated on purpose, proper use, and potential adverse effects.   -F/u labs ordered - BMET planned in 1 month -Counseled on lifestyle modifications for blood pressure control including reduced dietary sodium, increased exercise, adequate sleep. -Encouraged patient to check BP at work.    Tobacco Use Disorder - continues to smoke 7-8 cigarettes per day.  Still using nicotine  gum at work to decrease smoking while busy at work.  Reviewed goals of attempting to reduce smoking from current intake to 6 or less per day by mid-August.  Encouraged to contact [place] quitline to request assistance with gum.  Inquired about retesting for STD - recently treated for Trichomoniasis. - shared that retesting is performed at 3 months post treatment - he will plan to have performed at future visit.   Results reviewed and written information provided.    Written patient instructions provided. Patient verbalized understanding of treatment plan.  Total time in face to face counseling 27  minutes.    Follow-up:  Pharmacist 4-5 weeks  05/03/24. PCP clinic visit in [place].

## 2024-03-26 NOTE — Patient Instructions (Signed)
 Nice to see you today!  Blood Pressure is improved but not yet at goal Goal blood pressure is <130 and < 80   Medication Changes: - RE-START Olmesartan  hydrochlorothiazide  20/12/5mg  once daily.  - STOP Valsartan  Continue to use nicotine  gum with goal of reducing intake to 6 or less in the next month.    Tobacco Patient Instructions  Quitting smoking is one of the most important decisions you can make for your current and future health. Consider what you dislike about smoking and how quitting could personally benefit you. Try to cut down.   Aim for reducing the amount you smoke by 1-2 cigarettes per day over the next 4-5 weeks.  My target quit date is: August / September  Starting today, Be a Quitter!  Remind yourself why you want to quit.  Delay your first cigarette of the day for as long as possible.  Start cleaning out all pockets, drawers, and your car of cigarettes.  Getting Through the Cravings Once You Are Smoke Free: Each craving will last about 10 minutes, whether or not you smoke. Here's how to get through the cravings without cigarettes:  DELAY: Tell yourself that you'll wait for the next craving. Do it every time! DEEP BREATHS: One reason smoking feels good is because you breathe in deeply to inhale. Take four slow, deep breaths and feel the relaxation without the hamful effects of cigarettes. DRINK WATER: Drink a glass of cool water. It will give your hands and mouth something to do and will help flush the nicotine  out of your system faster. DIVERT: Do something else -- brush your teeth, take a walk, call a friend who can offer you support. Just moving onto something other than thinking about cigarettes will move you through the craving.   Frequently Asked Questions  What can I do when I get the urge to smoke? To get through the urge to smoke, try the following:  Review your reasons for quitting and think of all the benefits to your health, your finances, and your  family.  Remind yourself that there is no such thing as just one cigarette -- or even one puff.  Ride out the desire to smoke. Use the 4 Os -- Delay, Deep Breaths, Drink Water and Divert to get you through. The craving will go away eventually. Do not fool yourself into thinking you can have just one cigarette.  Any tips on how to deal with stress? Stress is a natural part of life. The key is to deal with it without reaching for a cigarette. Taking deep breaths, counting backwards from 10 and asking yourself 1-how big a deal is this?"  Writing down your feelings, talking with a friend and doing things like positive self-talk and meditation are some other ways that people deal with daily stress.  What if I start smoking again? Slips happen. Most people try to quit smoking a few times before they are successful. Don't beat yourself up if this happens to you! Ask yourself if this was a slip or a relapse. A slip is a one-time mistake that is quickly corrected. A relapse is going back to your old smoking habits.   If you slip, don't give up. Think of it as a learning experience. Ask yourself what went wrong and renew your commitment to staying away from smoking for good.  If you relapse, try not to get discouraged. Ask yourself the question "What caused me to start smoking?" Figure out what helped you and  what didn't when you tried to quit. Knowing why you relapsed is useful information for your next attempt to quit.  Please bring all medications to your clinic visits.  Please arrive 10-15 minutes prior to your scheduled visit time.

## 2024-03-26 NOTE — Assessment & Plan Note (Signed)
 Hypertension recently restarted on blood pressure lowering therapy and currently reports improved control based on at work readings.  Denies any side effects of therapy.  BP goal < 130/80  mmHg. Medication adherence appears good. Control is suboptimal due to suboptimal regimen.  - Stop Valsartan   - Restart olmesartan  hydrochlorothiazide as previously prescribed. Patient has supply remaining and is willing to use this as initial therapy.  -Patient educated on purpose, proper use, and potential adverse effects.   -F/u labs ordered - BMET planned in 1 month -Counseled on lifestyle modifications for blood pressure control including reduced dietary sodium, increased exercise, adequate sleep. -Encouraged patient to check BP at work.

## 2024-03-26 NOTE — Assessment & Plan Note (Signed)
  Tobacco Use Disorder - continues to smoke 7-8 cigarettes per day.  Still using nicotine  gum at work to decrease smoking while busy at work.  Reviewed goals of attempting to reduce smoking from current intake to 6 or less per day by mid-August.  Encouraged to contact [place] quitline to request assistance with gum.

## 2024-03-27 NOTE — Progress Notes (Signed)
 Reviewed and agree with Dr Macky Lower plan.

## 2024-04-02 ENCOUNTER — Other Ambulatory Visit: Payer: Self-pay

## 2024-04-02 DIAGNOSIS — I10 Essential (primary) hypertension: Secondary | ICD-10-CM

## 2024-04-02 NOTE — Telephone Encounter (Signed)
 Patient calls nurse line requesting that I send message to Dr. Koval regarding olmesartan - hydrochlorothiazide refill.   He states that he was unable to find medication at home and would need new refill sent to [facility] on [address].   Will forward request to Dr. Koval.   Chiquita JAYSON English, RN

## 2024-04-03 MED ORDER — OLMESARTAN MEDOXOMIL-HCTZ 20-12.5 MG PO TABS: 1.0000 | ORAL_TABLET | Freq: Every day | ORAL | 1 refills | Status: DC

## 2024-05-03 ENCOUNTER — Encounter: Payer: Self-pay | Admitting: Pharmacist

## 2024-05-03 ENCOUNTER — Ambulatory Visit (INDEPENDENT_AMBULATORY_CARE_PROVIDER_SITE_OTHER): Admitting: Pharmacist

## 2024-05-03 VITALS — BP 120/69 | HR 88 | Wt 126.0 lb

## 2024-05-03 DIAGNOSIS — I10 Essential (primary) hypertension: Secondary | ICD-10-CM

## 2024-05-03 DIAGNOSIS — F17200 Nicotine dependence, unspecified, uncomplicated: Secondary | ICD-10-CM

## 2024-05-03 MED ORDER — OLMESARTAN MEDOXOMIL-HCTZ 40-12.5 MG PO TABS: 1.0000 | ORAL_TABLET | Freq: Every day | ORAL | 1 refills | Status: DC

## 2024-05-03 NOTE — Assessment & Plan Note (Signed)
 Tobacco Use Disorder - reduced intake to 3 cigarettes per day.  Using nicotine  gum ~ 5 pieces per day.  Reviewed goals of attempting to reduce smoking at meals by eliminating the cigarette with mid-day meal next.

## 2024-05-03 NOTE — Progress Notes (Addendum)
 S:     Chief Complaint  Patient presents with   Medication Management    Hypertension and Tobacco    43 y.o. male who presents for hypertension and tobacco use evaluation, education, and management.    Patient was referred and last seen by Primary Care Provider, Dr. Orlando, on 02/28/2024.  At last visit, with me, patient blood pressure lowering therapy was prescribed as low-dose combination.  We also tobacco intake reduction.    Today, patient arrives in spirits and presents without assistance. Denies dizziness, headache, blurred vision, swelling.      Medication adherence reported as good . Patient reports taking BP medications today.    Current antihypertensives include: olmesartan /hydrochlorothiazide 20/12.5mg  daily    Reported home BP readings: 139-149 systolic  We agreed this was improved but also not yet at goal.   Tobacco intake reported as 3 cigs per day and using ~ 5 pieces of gum per day.  -shared that smoking is most commonly the end of meals.   Requested testing for STD  O:  Review of Systems  All other systems reviewed and are negative.   Physical Exam Vitals reviewed.  Constitutional:      Appearance: Normal appearance.  Pulmonary:     Effort: Pulmonary effort is normal.  Neurological:     Mental Status: He is alert.  Psychiatric:        Mood and Affect: Mood normal.        Behavior: Behavior normal.        Thought Content: Thought content normal.        Judgment: Judgment normal.    Last 3 Office BP readings: BP Readings from Last 3 Encounters:  05/03/24 120/69  03/26/24 (!) 148/92  02/28/24 (!) 137/97   BMET    Component Value Date/Time   NA 142 02/28/2024 1004   K 4.6 02/28/2024 1004   CL 107 (H) 02/28/2024 1004   CO2 20 02/28/2024 1004   GLUCOSE 102 (H) 02/28/2024 1004   GLUCOSE 109 (H) 03/29/2015 0425   BUN 14 02/28/2024 1004   CREATININE 0.93 02/28/2024 1004   CALCIUM 9.4 02/28/2024 1004   GFRNONAA >60 03/29/2015 0425   GFRAA  >60 03/29/2015 0425   A/P: Hypertension improved today in office and also improved control based on at work readings.  Denies any side effects of therapy.  BP goal < 130/80  mmHg. Medication adherence appears good. Control outside of office is concerning.  - Instructed to take current supply of your olmesartan /hydrochlorothiazide 20/12.5mg  daily blood pressure medication until gone, then to start new combination olmesartan /hydrochlorothiazide 40/12.5mg  1 tablet daily -Patient educated on purpose, proper use, and potential adverse effects.   -F/u labs ordered - BMET  -Counseled on lifestyle modifications for blood pressure control including reduced dietary sodium, increased exercise, adequate sleep. We discussed any dizziness being a reason to contact our office. -Encouraged patient to check BP at work and bring readings.     Tobacco Use Disorder - reduced intake to 3 cigarettes per day.  Using nicotine  gum ~ 5 pieces per day.  Reviewed goals of attempting to reduce smoking at meals by eliminating the cigarette with mid-day meal next.    Inquired about retesting for STD - no provider available - asked patient to find appointment for follow-up with this request on the way out today.  - Patient verbalized understanding.   Written patient instructions provided. Patient verbalized understanding of treatment plan.  Total time in face to face counseling 26  minutes.    Follow-up:  Pharmacist 6 weeks. PCP clinic visit with Dr. Elicia - 05/06/2024.   BMET - Normal values - unchanged from previous.  Communicated via patient message for patient with MyChart access and recent use.

## 2024-05-03 NOTE — Patient Instructions (Signed)
 It was nice to see you today!  Your goal blood pressure is  <130/80 mm Hg    Medication Changes: - Take your current supply of your blood pressure medication until gone  - THEN  start new combination therapy 1 tablet daily  Best of luck with your continued efforts to quit smoking.  Continue to use your gum at same level.   - Continue all other medication the same.  Keep up the good work with diet and exercise. Aim for a diet full of vegetables, fruit and lean meats (chicken, [place], fish). Try to limit salt intake by eating fresh or frozen vegetables (instead of canned), rinse canned vegetables prior to cooking and do not add any additional salt to meals.   Monitor blood pressure at home daily and keep a log (on your phone or piece of paper) to bring with you to your next visit. Write down date, time, blood pressure and pulse.   Please bring all medications to your clinic visits.  Please arrive 10-15 minutes prior to your scheduled visit time.

## 2024-05-03 NOTE — Assessment & Plan Note (Signed)
 Hypertension improved today in office and also improved control based on at work readings.  Denies any side effects of therapy.  BP goal < 130/80  mmHg. Medication adherence appears good. Control outside of office is concerning.  - Instructed to take current supply of your olmesartan /hydrochlorothiazide 20/12.5mg  daily blood pressure medication until gone, then to start new combination olmesartan /hydrochlorothiazide 40/12.5mg  1 tablet daily -Patient educated on purpose, proper use, and potential adverse effects.   -F/u labs ordered - BMET  -Counseled on lifestyle modifications for blood pressure control including reduced dietary sodium, increased exercise, adequate sleep. We discussed any dizziness being a reason to contact our office. -Encouraged patient to check BP at work and bring readings.

## 2024-05-04 LAB — BASIC METABOLIC PANEL WITH GFR
BUN/Creatinine Ratio: 14 (ref 9–20)
BUN: 15 mg/dL (ref 6–24)
CO2: 23 mmol/L (ref 20–29)
Calcium: 9.7 mg/dL (ref 8.7–10.2)
Chloride: 98 mmol/L (ref 96–106)
Creatinine, Ser: 1.09 mg/dL (ref 0.76–1.27)
Glucose: 102 mg/dL — ABNORMAL HIGH (ref 70–99)
Potassium: 5.2 mmol/L (ref 3.5–5.2)
Sodium: 135 mmol/L (ref 134–144)
eGFR: 87 mL/min/1.73 (ref >59)

## 2024-05-06 ENCOUNTER — Encounter: Payer: Self-pay | Admitting: Family Medicine

## 2024-05-06 ENCOUNTER — Encounter: Payer: Self-pay | Admitting: Pharmacist

## 2024-05-06 ENCOUNTER — Other Ambulatory Visit (HOSPITAL_COMMUNITY)
Admission: RE | Admit: 2024-05-06 | Discharge: 2024-05-06 | Disposition: A | Discharge status: Home / Self Care | Source: Ambulatory Visit | Attending: Family Medicine | Admitting: Family Medicine

## 2024-05-06 ENCOUNTER — Ambulatory Visit (INDEPENDENT_AMBULATORY_CARE_PROVIDER_SITE_OTHER): Admitting: Family Medicine

## 2024-05-06 VITALS — BP 102/78 | HR 81 | Ht 67.0 in | Wt 126.4 lb

## 2024-05-06 DIAGNOSIS — I10 Essential (primary) hypertension: Secondary | ICD-10-CM

## 2024-05-06 DIAGNOSIS — Z113 Encounter for screening for infections with a predominantly sexual mode of transmission: Secondary | ICD-10-CM | POA: Diagnosis present

## 2024-05-06 NOTE — Progress Notes (Signed)
 Reviewed and agree with Dr Macky Lower plan.

## 2024-05-06 NOTE — Patient Instructions (Signed)
 1) I will reach out with any abnormal test results.

## 2024-05-06 NOTE — Progress Notes (Unsigned)
    SUBJECTIVE:   CHIEF COMPLAINT / HPI:   ***     - No new symptoms, no new partners, no known exposure to STI. -     PERTINENT  PMH / PSH: ***  OBJECTIVE:   BP 102/78   Pulse 81   Ht 5' 7 (1.702 m)   Wt 126 lb 6.4 oz (57.3 kg)   SpO2 99%   BMI 19.80 kg/m   ***  ASSESSMENT/PLAN:   Assessment & Plan Routine screening for STI (sexually transmitted infection)      Twyla Nearing, MD [facility]

## 2024-05-06 NOTE — Progress Notes (Unsigned)
    SUBJECTIVE:   CHIEF COMPLAINT / HPI:   Hunter Newman is a 43yo M that pf HTN fu and STI testing  STI Testing Pt has no new partners or symptoms. He would like routine testing for STI  HTN - saw Dr. Amalia 7/25, BP controlled at that time. - BP improved now    OBJECTIVE:   BP 102/78   Pulse 81   Ht 5' 7 (1.702 m)   Wt 126 lb 6.4 oz (57.3 kg)   SpO2 99%   BMI 19.80 kg/m   General: Alert, pleasant man. NAD. HEENT: NCAT. MMM. Resp: Normal WOB on RA.  Ext: Moves all ext spontaneously Skin: Warm, well perfused   ASSESSMENT/PLAN:   Assessment & Plan Routine screening for STI (sexually transmitted infection) Primary hypertension At goal. Cont olmesartan /HCTZ   - f/u G/C, HIV, RPR   Hunter Nearing, MD [facility]

## 2024-05-07 ENCOUNTER — Ambulatory Visit: Payer: Self-pay | Admitting: Family Medicine

## 2024-05-07 LAB — URINE CYTOLOGY ANCILLARY ONLY
Chlamydia: NEGATIVE
Comment: NEGATIVE
Comment: NEGATIVE
Comment: NORMAL
Neisseria Gonorrhea: NEGATIVE
Trichomonas: NEGATIVE

## 2024-05-07 LAB — HIV ANTIBODY (ROUTINE TESTING W REFLEX): HIV Screen 4th Generation wRfx: NONREACTIVE

## 2024-05-07 LAB — RPR: RPR Ser Ql: NONREACTIVE

## 2024-05-08 NOTE — Assessment & Plan Note (Signed)
 At goal. Cont olmesartan /HCTZ

## 2024-07-02 ENCOUNTER — Other Ambulatory Visit: Payer: Self-pay

## 2024-07-02 DIAGNOSIS — F17200 Nicotine dependence, unspecified, uncomplicated: Secondary | ICD-10-CM

## 2024-07-02 MED ORDER — NICOTINE POLACRILEX 4 MG MT GUM: 4.0000 mg | CHEWING_GUM | OROMUCOSAL | 1 refills | Status: AC | PRN

## 2024-09-16 ENCOUNTER — Other Ambulatory Visit (HOSPITAL_COMMUNITY)
Admission: RE | Admit: 2024-09-16 | Discharge: 2024-09-16 | Disposition: A | Source: Ambulatory Visit | Attending: Family Medicine | Admitting: Family Medicine

## 2024-09-16 ENCOUNTER — Ambulatory Visit

## 2024-09-16 ENCOUNTER — Other Ambulatory Visit: Payer: Self-pay

## 2024-09-16 VITALS — BP 138/88 | HR 100 | Ht 67.0 in | Wt 132.0 lb

## 2024-09-16 DIAGNOSIS — Z113 Encounter for screening for infections with a predominantly sexual mode of transmission: Secondary | ICD-10-CM

## 2024-09-16 DIAGNOSIS — I10 Essential (primary) hypertension: Secondary | ICD-10-CM

## 2024-09-16 NOTE — Progress Notes (Signed)
    SUBJECTIVE:   CHIEF COMPLAINT / HPI:   Hunter Newman is a 43 y.o. male presenting for STD testing. No current symptoms.   HTN: stable on olmesartan -hydrochlorothiazide 40-12.5 mg  Denies side effects   Continues to smoke daily.   PERTINENT  PMH / PSH: reviewed and updated.  OBJECTIVE:   BP 138/88   Pulse 100   Ht 5' 7 (1.702 m)   Wt 132 lb (59.9 kg)   SpO2 99%   BMI 20.67 kg/m   Well-appearing, no acute distress Cardio: Regular rate, regular rhythm, no murmurs on exam. Pulm: Clear, no wheezing, no crackles. No increased work of breathing Abdominal: bowel sounds present, soft, non-tender, non-distended  ASSESSMENT/PLAN:   Assessment & Plan Primary hypertension BP above goal today. Feeling well.  Smoked before appointment  Continue medication at current dose  Follow up after the holidays with PCP for BP titration  Screen for STD (sexually transmitted disease) Urine cytology ordered for GC and trich  HIV and RPR ordered today      Damien Pinal, DO [facility]

## 2024-09-16 NOTE — Patient Instructions (Signed)
 It was great to see you today!   I have ordered lab work today. I will send you a message through MyChart or send you a letter with your results. If there is an abnormal result, I will give you a call.    No future appointments.  Please arrive 15 minutes before your appointment to ensure smooth check in process.  If you are more than 15 minutes late, you may be asked to reschedule.   Please bring a list of your medications with you to all appointments.   Please call the clinic at [phone] if your symptoms worsen or you have any concerns.  Thank you for allowing me to participate in your care, Dr. Damien [facility]

## 2024-09-16 NOTE — Assessment & Plan Note (Addendum)
 BP above goal today. Feeling well.  Smoked before appointment  Continue medication at current dose  Follow up after the holidays with PCP for BP titration

## 2024-09-17 LAB — HIV ANTIBODY (ROUTINE TESTING W REFLEX): HIV Screen 4th Generation wRfx: NONREACTIVE

## 2024-09-17 LAB — SYPHILIS: RPR W/REFLEX TO RPR TITER AND TREPONEMAL ANTIBODIES, TRADITIONAL SCREENING AND DIAGNOSIS ALGORITHM: RPR Ser Ql: NONREACTIVE

## 2024-09-18 ENCOUNTER — Ambulatory Visit: Payer: Self-pay | Admitting: Student

## 2024-09-18 LAB — URINE CYTOLOGY ANCILLARY ONLY
Chlamydia: NEGATIVE
Comment: NEGATIVE
Comment: NEGATIVE
Comment: NORMAL
Neisseria Gonorrhea: NEGATIVE
Trichomonas: NEGATIVE

## 2024-10-06 ENCOUNTER — Other Ambulatory Visit: Payer: Self-pay | Admitting: Family Medicine

## 2024-10-06 DIAGNOSIS — I10 Essential (primary) hypertension: Secondary | ICD-10-CM
